# Patient Record
Sex: Male | Born: 1945 | Race: White | Hispanic: No | Marital: Married | Smoking: Never smoker
Health system: Southern US, Community
[De-identification: ages and names within clinical notes are randomized; demographics above are authoritative.]

## PROBLEM LIST (undated history)

## (undated) DIAGNOSIS — I509 Heart failure, unspecified: Secondary | ICD-10-CM

---

## 2006-10-16 ENCOUNTER — Ambulatory Visit: Payer: Self-pay | Admitting: Anesthesiology

## 2006-10-21 ENCOUNTER — Ambulatory Visit: Payer: Self-pay | Admitting: Anesthesiology

## 2006-12-11 ENCOUNTER — Ambulatory Visit: Payer: Self-pay | Admitting: Anesthesiology

## 2007-02-06 ENCOUNTER — Ambulatory Visit: Payer: Self-pay | Admitting: Anesthesiology

## 2007-04-07 ENCOUNTER — Ambulatory Visit: Payer: Self-pay | Admitting: Anesthesiology

## 2009-07-13 ENCOUNTER — Ambulatory Visit: Payer: Self-pay | Admitting: Anesthesiology

## 2011-06-18 ENCOUNTER — Ambulatory Visit: Payer: Self-pay | Admitting: Anesthesiology

## 2011-07-10 ENCOUNTER — Ambulatory Visit: Payer: Self-pay | Admitting: Anesthesiology

## 2011-08-28 ENCOUNTER — Other Ambulatory Visit: Payer: Self-pay | Admitting: *Deleted

## 2011-08-28 ENCOUNTER — Other Ambulatory Visit: Payer: Self-pay | Admitting: Specialist

## 2011-08-28 DIAGNOSIS — M549 Dorsalgia, unspecified: Secondary | ICD-10-CM

## 2015-02-02 DIAGNOSIS — M5416 Radiculopathy, lumbar region: Secondary | ICD-10-CM | POA: Diagnosis not present

## 2015-02-02 DIAGNOSIS — M5136 Other intervertebral disc degeneration, lumbar region: Secondary | ICD-10-CM | POA: Diagnosis not present

## 2015-04-27 DIAGNOSIS — H35371 Puckering of macula, right eye: Secondary | ICD-10-CM | POA: Diagnosis not present

## 2016-03-11 DIAGNOSIS — M5136 Other intervertebral disc degeneration, lumbar region: Secondary | ICD-10-CM | POA: Diagnosis not present

## 2016-03-11 DIAGNOSIS — M5416 Radiculopathy, lumbar region: Secondary | ICD-10-CM | POA: Diagnosis not present

## 2016-03-11 DIAGNOSIS — M5030 Other cervical disc degeneration, unspecified cervical region: Secondary | ICD-10-CM | POA: Diagnosis not present

## 2016-03-11 DIAGNOSIS — M5412 Radiculopathy, cervical region: Secondary | ICD-10-CM | POA: Diagnosis not present

## 2016-11-13 DIAGNOSIS — M5136 Other intervertebral disc degeneration, lumbar region: Secondary | ICD-10-CM | POA: Diagnosis not present

## 2016-11-13 DIAGNOSIS — M5030 Other cervical disc degeneration, unspecified cervical region: Secondary | ICD-10-CM | POA: Diagnosis not present

## 2016-11-13 DIAGNOSIS — M5412 Radiculopathy, cervical region: Secondary | ICD-10-CM | POA: Diagnosis not present

## 2016-11-13 DIAGNOSIS — M5416 Radiculopathy, lumbar region: Secondary | ICD-10-CM | POA: Diagnosis not present

## 2016-11-13 DIAGNOSIS — M7542 Impingement syndrome of left shoulder: Secondary | ICD-10-CM | POA: Diagnosis not present

## 2017-02-21 DIAGNOSIS — M5030 Other cervical disc degeneration, unspecified cervical region: Secondary | ICD-10-CM | POA: Diagnosis not present

## 2017-02-21 DIAGNOSIS — M5412 Radiculopathy, cervical region: Secondary | ICD-10-CM | POA: Diagnosis not present

## 2017-02-21 DIAGNOSIS — M5136 Other intervertebral disc degeneration, lumbar region: Secondary | ICD-10-CM | POA: Diagnosis not present

## 2017-02-21 DIAGNOSIS — M5416 Radiculopathy, lumbar region: Secondary | ICD-10-CM | POA: Diagnosis not present

## 2017-04-03 DIAGNOSIS — M5416 Radiculopathy, lumbar region: Secondary | ICD-10-CM | POA: Diagnosis not present

## 2017-04-03 DIAGNOSIS — M5136 Other intervertebral disc degeneration, lumbar region: Secondary | ICD-10-CM | POA: Diagnosis not present

## 2017-07-23 DIAGNOSIS — M7542 Impingement syndrome of left shoulder: Secondary | ICD-10-CM | POA: Diagnosis not present

## 2017-07-23 DIAGNOSIS — M76892 Other specified enthesopathies of left lower limb, excluding foot: Secondary | ICD-10-CM | POA: Diagnosis not present

## 2017-07-23 DIAGNOSIS — M5416 Radiculopathy, lumbar region: Secondary | ICD-10-CM | POA: Diagnosis not present

## 2017-07-23 DIAGNOSIS — M5412 Radiculopathy, cervical region: Secondary | ICD-10-CM | POA: Diagnosis not present

## 2017-07-23 DIAGNOSIS — M5030 Other cervical disc degeneration, unspecified cervical region: Secondary | ICD-10-CM | POA: Diagnosis not present

## 2017-07-23 DIAGNOSIS — M5136 Other intervertebral disc degeneration, lumbar region: Secondary | ICD-10-CM | POA: Diagnosis not present

## 2017-11-28 DIAGNOSIS — M5416 Radiculopathy, lumbar region: Secondary | ICD-10-CM | POA: Diagnosis not present

## 2017-11-28 DIAGNOSIS — M5030 Other cervical disc degeneration, unspecified cervical region: Secondary | ICD-10-CM | POA: Diagnosis not present

## 2017-11-28 DIAGNOSIS — M5412 Radiculopathy, cervical region: Secondary | ICD-10-CM | POA: Diagnosis not present

## 2017-11-28 DIAGNOSIS — M5136 Other intervertebral disc degeneration, lumbar region: Secondary | ICD-10-CM | POA: Diagnosis not present

## 2017-11-28 DIAGNOSIS — M7542 Impingement syndrome of left shoulder: Secondary | ICD-10-CM | POA: Diagnosis not present

## 2017-11-28 DIAGNOSIS — M76892 Other specified enthesopathies of left lower limb, excluding foot: Secondary | ICD-10-CM | POA: Diagnosis not present

## 2018-01-21 DIAGNOSIS — M5136 Other intervertebral disc degeneration, lumbar region: Secondary | ICD-10-CM | POA: Diagnosis not present

## 2018-01-21 DIAGNOSIS — Z125 Encounter for screening for malignant neoplasm of prostate: Secondary | ICD-10-CM | POA: Diagnosis not present

## 2018-01-21 DIAGNOSIS — M1990 Unspecified osteoarthritis, unspecified site: Secondary | ICD-10-CM | POA: Diagnosis not present

## 2018-01-21 DIAGNOSIS — I350 Nonrheumatic aortic (valve) stenosis: Secondary | ICD-10-CM | POA: Diagnosis not present

## 2018-01-21 DIAGNOSIS — Z0001 Encounter for general adult medical examination with abnormal findings: Secondary | ICD-10-CM | POA: Diagnosis not present

## 2018-01-26 DIAGNOSIS — I350 Nonrheumatic aortic (valve) stenosis: Secondary | ICD-10-CM | POA: Diagnosis not present

## 2018-04-28 DIAGNOSIS — M5416 Radiculopathy, lumbar region: Secondary | ICD-10-CM | POA: Diagnosis not present

## 2018-04-28 DIAGNOSIS — M5136 Other intervertebral disc degeneration, lumbar region: Secondary | ICD-10-CM | POA: Diagnosis not present

## 2018-06-26 DIAGNOSIS — M47816 Spondylosis without myelopathy or radiculopathy, lumbar region: Secondary | ICD-10-CM | POA: Diagnosis not present

## 2018-06-26 DIAGNOSIS — M5136 Other intervertebral disc degeneration, lumbar region: Secondary | ICD-10-CM | POA: Diagnosis not present

## 2018-06-26 DIAGNOSIS — M545 Low back pain: Secondary | ICD-10-CM | POA: Diagnosis not present

## 2018-06-26 DIAGNOSIS — M5416 Radiculopathy, lumbar region: Secondary | ICD-10-CM | POA: Diagnosis not present

## 2018-06-29 ENCOUNTER — Other Ambulatory Visit: Payer: Self-pay | Admitting: Physical Medicine and Rehabilitation

## 2018-06-29 DIAGNOSIS — M5136 Other intervertebral disc degeneration, lumbar region: Secondary | ICD-10-CM

## 2018-07-09 ENCOUNTER — Ambulatory Visit
Admission: RE | Admit: 2018-07-09 | Discharge: 2018-07-09 | Disposition: A | Discharge status: Home / Self Care | Payer: Medicare HMO | Source: Ambulatory Visit | Attending: Physical Medicine and Rehabilitation | Admitting: Physical Medicine and Rehabilitation

## 2018-07-09 DIAGNOSIS — M48061 Spinal stenosis, lumbar region without neurogenic claudication: Secondary | ICD-10-CM | POA: Diagnosis not present

## 2018-07-09 DIAGNOSIS — M5136 Other intervertebral disc degeneration, lumbar region: Secondary | ICD-10-CM | POA: Insufficient documentation

## 2018-07-20 DIAGNOSIS — M5416 Radiculopathy, lumbar region: Secondary | ICD-10-CM | POA: Diagnosis not present

## 2018-07-20 DIAGNOSIS — M5136 Other intervertebral disc degeneration, lumbar region: Secondary | ICD-10-CM | POA: Diagnosis not present

## 2018-07-20 DIAGNOSIS — M48062 Spinal stenosis, lumbar region with neurogenic claudication: Secondary | ICD-10-CM | POA: Diagnosis not present

## 2019-01-22 DIAGNOSIS — M7541 Impingement syndrome of right shoulder: Secondary | ICD-10-CM | POA: Diagnosis not present

## 2019-01-22 DIAGNOSIS — M5412 Radiculopathy, cervical region: Secondary | ICD-10-CM | POA: Diagnosis not present

## 2019-01-22 DIAGNOSIS — M5416 Radiculopathy, lumbar region: Secondary | ICD-10-CM | POA: Diagnosis not present

## 2019-01-22 DIAGNOSIS — M5136 Other intervertebral disc degeneration, lumbar region: Secondary | ICD-10-CM | POA: Diagnosis not present

## 2019-01-22 DIAGNOSIS — M7542 Impingement syndrome of left shoulder: Secondary | ICD-10-CM | POA: Diagnosis not present

## 2019-01-22 DIAGNOSIS — M5030 Other cervical disc degeneration, unspecified cervical region: Secondary | ICD-10-CM | POA: Diagnosis not present

## 2019-08-31 DIAGNOSIS — M7542 Impingement syndrome of left shoulder: Secondary | ICD-10-CM | POA: Diagnosis not present

## 2019-08-31 DIAGNOSIS — M7541 Impingement syndrome of right shoulder: Secondary | ICD-10-CM | POA: Diagnosis not present

## 2019-09-22 DIAGNOSIS — M48062 Spinal stenosis, lumbar region with neurogenic claudication: Secondary | ICD-10-CM | POA: Diagnosis not present

## 2019-09-22 DIAGNOSIS — M5416 Radiculopathy, lumbar region: Secondary | ICD-10-CM | POA: Diagnosis not present

## 2019-09-22 DIAGNOSIS — M5030 Other cervical disc degeneration, unspecified cervical region: Secondary | ICD-10-CM | POA: Diagnosis not present

## 2019-09-22 DIAGNOSIS — M5136 Other intervertebral disc degeneration, lumbar region: Secondary | ICD-10-CM | POA: Diagnosis not present

## 2019-09-22 DIAGNOSIS — M7541 Impingement syndrome of right shoulder: Secondary | ICD-10-CM | POA: Diagnosis not present

## 2019-09-22 DIAGNOSIS — M7542 Impingement syndrome of left shoulder: Secondary | ICD-10-CM | POA: Diagnosis not present

## 2019-09-22 DIAGNOSIS — M5412 Radiculopathy, cervical region: Secondary | ICD-10-CM | POA: Diagnosis not present

## 2020-03-22 DIAGNOSIS — M5030 Other cervical disc degeneration, unspecified cervical region: Secondary | ICD-10-CM | POA: Diagnosis not present

## 2020-03-22 DIAGNOSIS — M5136 Other intervertebral disc degeneration, lumbar region: Secondary | ICD-10-CM | POA: Diagnosis not present

## 2020-03-22 DIAGNOSIS — M5416 Radiculopathy, lumbar region: Secondary | ICD-10-CM | POA: Diagnosis not present

## 2020-03-22 DIAGNOSIS — M5412 Radiculopathy, cervical region: Secondary | ICD-10-CM | POA: Diagnosis not present

## 2020-03-22 DIAGNOSIS — M7542 Impingement syndrome of left shoulder: Secondary | ICD-10-CM | POA: Diagnosis not present

## 2020-03-22 DIAGNOSIS — M7541 Impingement syndrome of right shoulder: Secondary | ICD-10-CM | POA: Diagnosis not present

## 2020-03-22 DIAGNOSIS — M48062 Spinal stenosis, lumbar region with neurogenic claudication: Secondary | ICD-10-CM | POA: Diagnosis not present

## 2020-05-31 ENCOUNTER — Other Ambulatory Visit: Payer: Self-pay | Admitting: Physical Medicine and Rehabilitation

## 2020-05-31 DIAGNOSIS — M4802 Spinal stenosis, cervical region: Secondary | ICD-10-CM | POA: Diagnosis not present

## 2020-05-31 DIAGNOSIS — M5412 Radiculopathy, cervical region: Secondary | ICD-10-CM | POA: Diagnosis not present

## 2020-05-31 DIAGNOSIS — M75102 Unspecified rotator cuff tear or rupture of left shoulder, not specified as traumatic: Secondary | ICD-10-CM | POA: Diagnosis not present

## 2020-05-31 DIAGNOSIS — M5030 Other cervical disc degeneration, unspecified cervical region: Secondary | ICD-10-CM

## 2020-06-07 DIAGNOSIS — M25512 Pain in left shoulder: Secondary | ICD-10-CM | POA: Diagnosis not present

## 2020-06-07 DIAGNOSIS — G8929 Other chronic pain: Secondary | ICD-10-CM | POA: Diagnosis not present

## 2020-06-07 DIAGNOSIS — M19212 Secondary osteoarthritis, left shoulder: Secondary | ICD-10-CM | POA: Diagnosis not present

## 2020-06-07 DIAGNOSIS — M12812 Other specific arthropathies, not elsewhere classified, left shoulder: Secondary | ICD-10-CM | POA: Diagnosis not present

## 2020-06-07 DIAGNOSIS — M75122 Complete rotator cuff tear or rupture of left shoulder, not specified as traumatic: Secondary | ICD-10-CM | POA: Diagnosis not present

## 2020-06-07 DIAGNOSIS — M7552 Bursitis of left shoulder: Secondary | ICD-10-CM | POA: Diagnosis not present

## 2020-06-08 ENCOUNTER — Ambulatory Visit: Payer: Medicare HMO

## 2020-06-14 ENCOUNTER — Ambulatory Visit
Admission: RE | Admit: 2020-06-14 | Discharge: 2020-06-14 | Disposition: A | Discharge status: Home / Self Care | Payer: Medicare HMO | Source: Ambulatory Visit | Attending: Physical Medicine and Rehabilitation | Admitting: Physical Medicine and Rehabilitation

## 2020-06-14 ENCOUNTER — Other Ambulatory Visit: Payer: Self-pay

## 2020-06-14 DIAGNOSIS — M542 Cervicalgia: Secondary | ICD-10-CM | POA: Diagnosis not present

## 2020-06-14 DIAGNOSIS — M5030 Other cervical disc degeneration, unspecified cervical region: Secondary | ICD-10-CM | POA: Diagnosis not present

## 2020-07-10 DIAGNOSIS — M5030 Other cervical disc degeneration, unspecified cervical region: Secondary | ICD-10-CM | POA: Diagnosis not present

## 2020-07-10 DIAGNOSIS — M48062 Spinal stenosis, lumbar region with neurogenic claudication: Secondary | ICD-10-CM | POA: Diagnosis not present

## 2020-07-10 DIAGNOSIS — M4802 Spinal stenosis, cervical region: Secondary | ICD-10-CM | POA: Diagnosis not present

## 2020-07-10 DIAGNOSIS — M7542 Impingement syndrome of left shoulder: Secondary | ICD-10-CM | POA: Diagnosis not present

## 2020-07-10 DIAGNOSIS — M5412 Radiculopathy, cervical region: Secondary | ICD-10-CM | POA: Diagnosis not present

## 2020-07-10 DIAGNOSIS — M5416 Radiculopathy, lumbar region: Secondary | ICD-10-CM | POA: Diagnosis not present

## 2020-07-10 DIAGNOSIS — M5136 Other intervertebral disc degeneration, lumbar region: Secondary | ICD-10-CM | POA: Diagnosis not present

## 2020-07-10 DIAGNOSIS — M7541 Impingement syndrome of right shoulder: Secondary | ICD-10-CM | POA: Diagnosis not present

## 2020-07-20 DIAGNOSIS — M4802 Spinal stenosis, cervical region: Secondary | ICD-10-CM | POA: Diagnosis not present

## 2020-07-20 DIAGNOSIS — M5030 Other cervical disc degeneration, unspecified cervical region: Secondary | ICD-10-CM | POA: Diagnosis not present

## 2020-07-20 DIAGNOSIS — M5412 Radiculopathy, cervical region: Secondary | ICD-10-CM | POA: Diagnosis not present

## 2020-09-14 ENCOUNTER — Encounter: Payer: Self-pay | Admitting: Emergency Medicine

## 2020-09-14 ENCOUNTER — Other Ambulatory Visit: Payer: Self-pay

## 2020-09-14 ENCOUNTER — Emergency Department: Payer: Medicare HMO

## 2020-09-14 ENCOUNTER — Inpatient Hospital Stay
Admission: EM | Admit: 2020-09-14 | Discharge: 2020-09-15 | DRG: 291 | Disposition: A | Discharge status: Hospice | Payer: Medicare HMO | Attending: Internal Medicine | Admitting: Internal Medicine

## 2020-09-14 DIAGNOSIS — R7401 Elevation of levels of liver transaminase levels: Secondary | ICD-10-CM | POA: Diagnosis present

## 2020-09-14 DIAGNOSIS — D649 Anemia, unspecified: Secondary | ICD-10-CM | POA: Diagnosis present

## 2020-09-14 DIAGNOSIS — I5031 Acute diastolic (congestive) heart failure: Secondary | ICD-10-CM | POA: Diagnosis not present

## 2020-09-14 DIAGNOSIS — Z66 Do not resuscitate: Secondary | ICD-10-CM | POA: Diagnosis not present

## 2020-09-14 DIAGNOSIS — M1990 Unspecified osteoarthritis, unspecified site: Secondary | ICD-10-CM | POA: Diagnosis present

## 2020-09-14 DIAGNOSIS — J9811 Atelectasis: Secondary | ICD-10-CM | POA: Diagnosis present

## 2020-09-14 DIAGNOSIS — R350 Frequency of micturition: Secondary | ICD-10-CM | POA: Diagnosis not present

## 2020-09-14 DIAGNOSIS — I5043 Acute on chronic combined systolic (congestive) and diastolic (congestive) heart failure: Secondary | ICD-10-CM | POA: Diagnosis not present

## 2020-09-14 DIAGNOSIS — Z20822 Contact with and (suspected) exposure to covid-19: Secondary | ICD-10-CM | POA: Diagnosis present

## 2020-09-14 DIAGNOSIS — R64 Cachexia: Secondary | ICD-10-CM | POA: Diagnosis present

## 2020-09-14 DIAGNOSIS — I350 Nonrheumatic aortic (valve) stenosis: Secondary | ICD-10-CM | POA: Diagnosis not present

## 2020-09-14 DIAGNOSIS — I493 Ventricular premature depolarization: Secondary | ICD-10-CM | POA: Diagnosis present

## 2020-09-14 DIAGNOSIS — F0390 Unspecified dementia without behavioral disturbance: Secondary | ICD-10-CM | POA: Diagnosis present

## 2020-09-14 DIAGNOSIS — Z515 Encounter for palliative care: Secondary | ICD-10-CM | POA: Diagnosis not present

## 2020-09-14 DIAGNOSIS — I5041 Acute combined systolic (congestive) and diastolic (congestive) heart failure: Secondary | ICD-10-CM | POA: Diagnosis not present

## 2020-09-14 DIAGNOSIS — I509 Heart failure, unspecified: Secondary | ICD-10-CM

## 2020-09-14 DIAGNOSIS — I700 Atherosclerosis of aorta: Secondary | ICD-10-CM | POA: Diagnosis present

## 2020-09-14 DIAGNOSIS — Z79891 Long term (current) use of opiate analgesic: Secondary | ICD-10-CM | POA: Diagnosis not present

## 2020-09-14 DIAGNOSIS — I110 Hypertensive heart disease with heart failure: Secondary | ICD-10-CM | POA: Diagnosis not present

## 2020-09-14 DIAGNOSIS — I420 Dilated cardiomyopathy: Secondary | ICD-10-CM

## 2020-09-14 DIAGNOSIS — Z681 Body mass index (BMI) 19 or less, adult: Secondary | ICD-10-CM | POA: Diagnosis not present

## 2020-09-14 DIAGNOSIS — F1020 Alcohol dependence, uncomplicated: Secondary | ICD-10-CM | POA: Diagnosis present

## 2020-09-14 DIAGNOSIS — R059 Cough, unspecified: Secondary | ICD-10-CM | POA: Diagnosis not present

## 2020-09-14 DIAGNOSIS — R140 Abdominal distension (gaseous): Secondary | ICD-10-CM | POA: Diagnosis not present

## 2020-09-14 DIAGNOSIS — E43 Unspecified severe protein-calorie malnutrition: Secondary | ICD-10-CM

## 2020-09-14 DIAGNOSIS — I10 Essential (primary) hypertension: Secondary | ICD-10-CM | POA: Diagnosis present

## 2020-09-14 DIAGNOSIS — R410 Disorientation, unspecified: Secondary | ICD-10-CM | POA: Diagnosis not present

## 2020-09-14 DIAGNOSIS — J90 Pleural effusion, not elsewhere classified: Secondary | ICD-10-CM | POA: Diagnosis not present

## 2020-09-14 DIAGNOSIS — I517 Cardiomegaly: Secondary | ICD-10-CM | POA: Diagnosis not present

## 2020-09-14 DIAGNOSIS — R531 Weakness: Secondary | ICD-10-CM | POA: Diagnosis not present

## 2020-09-14 DIAGNOSIS — R778 Other specified abnormalities of plasma proteins: Secondary | ICD-10-CM

## 2020-09-14 DIAGNOSIS — Z791 Long term (current) use of non-steroidal anti-inflammatories (NSAID): Secondary | ICD-10-CM

## 2020-09-14 DIAGNOSIS — Z7189 Other specified counseling: Secondary | ICD-10-CM | POA: Diagnosis not present

## 2020-09-14 DIAGNOSIS — R0602 Shortness of breath: Secondary | ICD-10-CM | POA: Diagnosis not present

## 2020-09-14 HISTORY — DX: Heart failure, unspecified: I50.9

## 2020-09-14 LAB — URINALYSIS, COMPLETE (UACMP) WITH MICROSCOPIC
Bacteria, UA: NONE SEEN
Bilirubin Urine: NEGATIVE
Glucose, UA: NEGATIVE mg/dL
Ketones, ur: 5 mg/dL — AB
Leukocytes,Ua: NEGATIVE
Nitrite: NEGATIVE
Protein, ur: 30 mg/dL — AB
Specific Gravity, Urine: 1.027 (ref 1.005–1.030)
Squamous Epithelial / HPF: NONE SEEN (ref 0–5)
pH: 5 (ref 5.0–8.0)

## 2020-09-14 LAB — PROCALCITONIN: Procalcitonin: <0.1 ng/mL

## 2020-09-14 LAB — HEPATIC FUNCTION PANEL
ALT: 50 U/L — ABNORMAL HIGH (ref 0–44)
AST: 61 U/L — ABNORMAL HIGH (ref 15–41)
Albumin: 4.3 g/dL (ref 3.5–5.0)
Alkaline Phosphatase: 55 U/L (ref 38–126)
Bilirubin, Direct: 0.6 mg/dL — ABNORMAL HIGH (ref 0.0–0.2)
Indirect Bilirubin: 1.6 mg/dL — ABNORMAL HIGH (ref 0.3–0.9)
Total Bilirubin: 2.2 mg/dL — ABNORMAL HIGH (ref 0.3–1.2)
Total Protein: 6.8 g/dL (ref 6.5–8.1)

## 2020-09-14 LAB — RESP PANEL BY RT-PCR (FLU A&B, COVID) ARPGX2
Influenza A by PCR: NEGATIVE
Influenza B by PCR: NEGATIVE
SARS Coronavirus 2 by RT PCR: NEGATIVE

## 2020-09-14 LAB — CBC
HCT: 44.2 % (ref 39.0–52.0)
Hemoglobin: 14.5 g/dL (ref 13.0–17.0)
MCH: 31.7 pg (ref 26.0–34.0)
MCHC: 32.8 g/dL (ref 30.0–36.0)
MCV: 96.5 fL (ref 80.0–100.0)
Platelets: 174 10*3/uL (ref 150–400)
RBC: 4.58 MIL/uL (ref 4.22–5.81)
RDW: 17.1 % — ABNORMAL HIGH (ref 11.5–15.5)
WBC: 5.9 10*3/uL (ref 4.0–10.5)
nRBC: 0 % (ref 0.0–0.2)

## 2020-09-14 LAB — T4, FREE: Free T4: 1.19 ng/dL — ABNORMAL HIGH (ref 0.61–1.12)

## 2020-09-14 LAB — BASIC METABOLIC PANEL
Anion gap: 9 (ref 5–15)
BUN: 33 mg/dL — ABNORMAL HIGH (ref 8–23)
CO2: 25 mmol/L (ref 22–32)
Calcium: 9.8 mg/dL (ref 8.9–10.3)
Chloride: 106 mmol/L (ref 98–111)
Creatinine, Ser: 1.14 mg/dL (ref 0.61–1.24)
GFR, Estimated: >60 mL/min (ref >60)
Glucose, Bld: 104 mg/dL — ABNORMAL HIGH (ref 70–99)
Potassium: 4.1 mmol/L (ref 3.5–5.1)
Sodium: 140 mmol/L (ref 135–145)

## 2020-09-14 LAB — AMMONIA: Ammonia: 12 umol/L (ref 9–35)

## 2020-09-14 LAB — BRAIN NATRIURETIC PEPTIDE: B Natriuretic Peptide: >4500 pg/mL — ABNORMAL HIGH (ref 0.0–100.0)

## 2020-09-14 LAB — MAGNESIUM: Magnesium: 1.8 mg/dL (ref 1.7–2.4)

## 2020-09-14 LAB — TROPONIN I (HIGH SENSITIVITY)
Troponin I (High Sensitivity): 85 ng/L — ABNORMAL HIGH (ref <18)
Troponin I (High Sensitivity): 68 ng/L — ABNORMAL HIGH (ref <18)

## 2020-09-14 LAB — TSH: TSH: 2.012 u[IU]/mL (ref 0.350–4.500)

## 2020-09-14 LAB — PHOSPHORUS: Phosphorus: 3.1 mg/dL (ref 2.5–4.6)

## 2020-09-14 MED ORDER — DOXYCYCLINE HYCLATE 100 MG PO TABS
100.0000 mg | ORAL_TABLET | Freq: Once | ORAL | Status: DC
  Filled 2020-09-14: qty 1

## 2020-09-14 MED ORDER — LORAZEPAM 1 MG PO TABS: 1.0000 mg | ORAL_TABLET | ORAL | Status: DC | PRN

## 2020-09-14 MED ORDER — SODIUM CHLORIDE 0.9 % IV SOLN: 250.0000 mL | INTRAVENOUS | Status: DC | PRN

## 2020-09-14 MED ORDER — SODIUM CHLORIDE 0.9% FLUSH: 3.0000 mL | INTRAVENOUS | Status: DC | PRN

## 2020-09-14 MED ORDER — ADULT MULTIVITAMIN W/MINERALS CH
1.0000 | ORAL_TABLET | Freq: Every day | ORAL | Status: DC
  Administered 2020-09-14 – 2020-09-15 (×2): 1 via ORAL
  Filled 2020-09-14 (×2): qty 1

## 2020-09-14 MED ORDER — SODIUM CHLORIDE 0.9% FLUSH
3.0000 mL | Freq: Two times a day (BID) | INTRAVENOUS | Status: DC
  Administered 2020-09-14: 3 mL via INTRAVENOUS

## 2020-09-14 MED ORDER — THIAMINE HCL 100 MG PO TABS
100.0000 mg | ORAL_TABLET | Freq: Every day | ORAL | Status: DC
  Administered 2020-09-14 – 2020-09-15 (×2): 100 mg via ORAL
  Filled 2020-09-14 (×2): qty 1

## 2020-09-14 MED ORDER — LORAZEPAM 2 MG PO TABS
0.0000 mg | ORAL_TABLET | Freq: Four times a day (QID) | ORAL | Status: DC
  Administered 2020-09-14: 2 mg via ORAL
  Filled 2020-09-14: qty 1

## 2020-09-14 MED ORDER — TRAMADOL HCL 50 MG PO TABS
50.0000 mg | ORAL_TABLET | Freq: Two times a day (BID) | ORAL | Status: DC
  Administered 2020-09-14: 100 mg via ORAL
  Administered 2020-09-15: 50 mg via ORAL
  Filled 2020-09-14: qty 2
  Filled 2020-09-14: qty 1

## 2020-09-14 MED ORDER — ENOXAPARIN SODIUM 40 MG/0.4ML IJ SOSY
40.0000 mg | PREFILLED_SYRINGE | INTRAMUSCULAR | Status: DC
  Administered 2020-09-14: 40 mg via SUBCUTANEOUS
  Filled 2020-09-14: qty 0.4

## 2020-09-14 MED ORDER — IOHEXOL 350 MG/ML SOLN
75.0000 mL | Freq: Once | INTRAVENOUS | Status: AC | PRN
  Administered 2020-09-14: 75 mL via INTRAVENOUS

## 2020-09-14 MED ORDER — ONDANSETRON HCL 4 MG/2ML IJ SOLN: 4.0000 mg | Freq: Four times a day (QID) | INTRAMUSCULAR | Status: DC | PRN

## 2020-09-14 MED ORDER — FOLIC ACID 1 MG PO TABS
1.0000 mg | ORAL_TABLET | Freq: Every day | ORAL | Status: DC
  Administered 2020-09-14 – 2020-09-15 (×2): 1 mg via ORAL
  Filled 2020-09-14 (×2): qty 1

## 2020-09-14 MED ORDER — THIAMINE HCL 100 MG/ML IJ SOLN: 100.0000 mg | Freq: Every day | INTRAMUSCULAR | Status: DC

## 2020-09-14 MED ORDER — LORAZEPAM 2 MG PO TABS: 0.0000 mg | ORAL_TABLET | Freq: Two times a day (BID) | ORAL | Status: DC

## 2020-09-14 MED ORDER — ACETAMINOPHEN 325 MG PO TABS: 650.0000 mg | ORAL_TABLET | ORAL | Status: DC | PRN

## 2020-09-14 MED ORDER — METOPROLOL SUCCINATE ER 25 MG PO TB24
25.0000 mg | ORAL_TABLET | Freq: Every day | ORAL | Status: DC
  Administered 2020-09-14 – 2020-09-15 (×2): 25 mg via ORAL
  Filled 2020-09-14 (×2): qty 1

## 2020-09-14 MED ORDER — FUROSEMIDE 10 MG/ML IJ SOLN
40.0000 mg | Freq: Two times a day (BID) | INTRAMUSCULAR | Status: DC
  Administered 2020-09-14 – 2020-09-15 (×2): 40 mg via INTRAVENOUS
  Filled 2020-09-14 (×2): qty 4

## 2020-09-14 MED ORDER — LORAZEPAM 2 MG/ML IJ SOLN
1.0000 mg | INTRAMUSCULAR | Status: DC | PRN
Start: 2020-09-14 — End: 2020-09-15

## 2020-09-14 NOTE — H&P (Addendum)
History and Physical    Rufina FalcoJohn G Moser QMV:[id number] DOB: March 14, 1946 DOA: 09/14/2020  PCP: Gracelyn NurseJohnston, Maurion D, MD   Patient coming from: Home  I have personally briefly reviewed patient[facility]  Chief Complaint: Shortness of breath  HPI: Rufina FalcoJohn G Dorion is a 75 y.o. male with medical history significant for severe aortic stenosis, alcohol dependence who presents to the ER with his wife for evaluation of worsening shortness of breath over the last 1 week and increased fatigue and weakness.  Patient's wife states that she noticed he has become more short of breath with any form of exertion and gets increasingly tired and fatigued.  She also thinks that he is more confused than his baseline.  Patient also has a cough productive of clear phlegm. She states that patient has a known valvular disease and was offered surgery a couple of years ago but patient declined. He denies having any chest pain, no leg swelling, no orthopnea or paroxysmal nocturnal dyspnea. He denies having any fever or chills, no abdominal pain, no dizziness, no lightheadedness, no headache, no urinary symptoms, no changes in his bowel habits, no palpitations, no diaphoresis or any syncopal episode. Labs show sodium 140, potassium 4.1, chloride 106, bicarb 25, glucose 104, BUN 33, creatinine 1.14, calcium 9.8, alkaline phosphatase 55, albumin 4.3, AST 61, ALT 15, total protein 6.8, ammonia 12, BNP 4500, troponin 85 >> 68, white cell count 5.9, hemoglobin 14.5, hematocrit 44.2, MCV 96.5, RDW 17.1, platelet count 174, TSH 2.012 Respiratory viral panel is negative Chest x-ray reviewed by me shows mild scarring left base. No edema or consolidation. Mild cardiac enlargement. CT angiogram of the chest shows no evidence of pulmonary embolism. Moderate left and small right pleural effusions with associated mild atelectasis. Minimal diffuse bilateral ground-glass opacities likely reflect mild pulmonary edema  and/or atypical pneumonia. Cardiomegaly. Small volume fluid in the pelvis and anasarca. Aortic Atherosclerosis  Twelve-lead EKG reviewed by me shows sinus rhythm, PVCs and Q waves most likely related to LVH.   ED Course: Patient is 75 year old who presents to the ER for evaluation of worsening shortness of breath, cough productive of clear phlegm and increased fatigue.  Imaging shows bilateral pleural effusions and patient's BNP is elevated.  He has a history of severe aortic stenosis and declined surgery in the past. He received IV Lasix in the ER and will be admitted to the hospital for further evaluation.   Review of Systems: As per HPI otherwise all other systems reviewed and negative.    Past Medical History:  Diagnosis Date  . CHF (congestive heart failure) (HCC)       reports that he has never smoked. He has never used smokeless tobacco. He reports current alcohol use of about 3.0 standard drinks of alcohol per week. No history on file for drug use.  No Known Allergies  Family History  Family history unknown: Yes     Prior to Admission medications   Medication Sig Start Date End Date Taking? Authorizing Provider  meloxicam (MOBIC) 15 MG tablet Take 15 mg by mouth daily. 06/23/20  Yes [provider]  traMADol (ULTRAM) 50 MG tablet Take 50-100 mg by mouth in the morning, at noon, and at bedtime. 07/30/20  Yes [provider]    Physical Exam: Vitals:   09/14/20 1530 09/14/20 1600 09/14/20 1630 09/14/20 1720  BP: (!) 117/101 (!) 161/101 (!) 130/98 (!) 144/103  Pulse: 79 79 78 75  Resp: 17 18 19  16  Temp:    98.4 F (36.9 C)  TempSrc:      SpO2: 97% 100% 98% 100%  Weight:      Height:         Vitals:   09/14/20 1530 09/14/20 1600 09/14/20 1630 09/14/20 1720  BP: (!) 117/101 (!) 161/101 (!) 130/98 (!) 144/103  Pulse: 79 79 78 75  Resp: Temp:    98.4 F (36.9 C)  TempSrc:      SpO2: 97% 100% 98% 100%  Weight:      Height:           Constitutional: Alert and oriented x 3 . Not in any apparent distress.  He is comfortable HEENT:      Head: Normocephalic and atraumatic.         Eyes: PERLA, EOMI, Conjunctivae are normal. Sclera is non-icteric.       Mouth/Throat: Mucous membranes are moist.       Neck: Supple with no signs of meningismus. Cardiovascular: Regular rate and rhythm.  Systolic ejection murmur, no gallops, or rubs. 2+ symmetrical distal pulses are present . No JVD. No LE edema Respiratory: Respiratory effort normal .Crackles at the bases bilaterally. No wheezes or rhonchi.  Gastrointestinal: Soft, non tender, and non distended with positive bowel sounds.  Genitourinary: No CVA tenderness. Musculoskeletal: Nontender with normal range of motion in all extremities. No cyanosis, or erythema of extremities. Neurologic:  Face is symmetric. Moving all extremities. No gross focal neurologic deficits . Skin: Skin is warm, dry.  No rash or ulcers Psychiatric: Mood and affect are normal   Labs on Admission: I have personally reviewed following labs and imaging studies  CBC: Recent Labs  Lab 09/14/20 1059  WBC 5.9  HGB 14.5  HCT 44.2  MCV 96.5  PLT 174   Basic Metabolic Panel: Recent Labs  Lab 09/14/20 1059  NA 140  K 4.1  CL 106  CO2 25  GLUCOSE 104*  BUN 33*  CREATININE 1.14  CALCIUM 9.8   GFR: Estimated Creatinine Clearance: 47.7 mL/min (by C-G formula based on SCr of 1.14 mg/dL). Liver Function Tests: Recent Labs  Lab 09/14/20 1059  AST 61*  ALT 50*  ALKPHOS 55  BILITOT 2.2*  PROT 6.8  ALBUMIN 4.3   No results for input(s): LIPASE, AMYLASE in the last 168 hours. Recent Labs  Lab 09/14/20 1130  AMMONIA 12   Coagulation Profile: No results for input(s): INR, PROTIME in the last 168 hours. Cardiac Enzymes: No results for input(s): CKTOTAL, CKMB, CKMBINDEX, TROPONINI in the last 168 hours. BNP (last 3 results) No results for input(s): PROBNP in the last 8760  hours. HbA1C: No results for input(s): HGBA1C in the last 72 hours. CBG: No results for input(s): GLUCAP in the last 168 hours. Lipid Profile: No results for input(s): CHOL, HDL, LDLCALC, TRIG, CHOLHDL, LDLDIRECT in the last 72 hours. Thyroid Function Tests: Recent Labs    09/14/20 1059  TSH 2.012  FREET4 1.19*   Anemia Panel: No results for input(s): VITAMINB12, FOLATE, FERRITIN, TIBC, IRON, RETICCTPCT in the last 72 hours. Urine analysis:    Component Value Date/Time   COLORURINE AMBER (A) 09/14/2020 1256   APPEARANCEUR HAZY (A) 09/14/2020 1256   LABSPEC 1.027 09/14/2020 1256   PHURINE 5.0 09/14/2020 1256   GLUCOSEU NEGATIVE 09/14/2020 1256   HGBUR SMALL (A) 09/14/2020 1256   BILIRUBINUR NEGATIVE 09/14/2020 1256   KETONESUR 5 (A) 09/14/2020 1256   PROTEINUR 30 (A)  09/14/2020 1256   NITRITE NEGATIVE 09/14/2020 1256   LEUKOCYTESUR NEGATIVE 09/14/2020 1256    Radiological Exams on Admission: CT Head Wo Contrast  Result Date: 09/14/2020 CLINICAL DATA:  Worsening confusion. EXAM: CT HEAD WITHOUT CONTRAST TECHNIQUE: Contiguous axial images were obtained from the base of the skull through the vertex without intravenous contrast. COMPARISON:  None. FINDINGS: Brain: Age related volume loss. Mild chronic small-vessel change of the white matter. No sign of acute infarction, mass lesion, hemorrhage, hydrocephalus or extra-axial collection. Vascular: There is atherosclerotic calcification of the major vessels at the base of the brain. Skull: No skull fracture. 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 IMPRESSION: 1. No acute finding by CT. Age related volume loss and mild chronic small-vessel change of the white matter. 2. 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  Electronically Signed   By: Paulina Fusi M.D.   On: 09/14/2020 14:33   CT Angio Chest PE W and/or Wo Contrast  Result Date: 09/14/2020 CLINICAL DATA:  Shortness of breath and productive cough over the past week as well as abdominal distension. EXAM: CT ANGIOGRAPHY CHEST CT ABDOMEN AND PELVIS WITH CONTRAST TECHNIQUE: Multidetector CT imaging of the chest was performed using the standard protocol during bolus administration of intravenous contrast. Multiplanar CT image reconstructions and MIPs were obtained to evaluate the vascular anatomy. Multidetector CT imaging of the abdomen and pelvis was performed using the standard protocol during bolus administration of intravenous contrast. CONTRAST:  44mL OMNIPAQUE IOHEXOL 350 MG/ML SOLN COMPARISON:  Same day chest radiograph, CT lumbar spine dated 07/09/2018. FINDINGS: CTA CHEST FINDINGS Cardiovascular: Satisfactory opacification of the pulmonary arteries to the segmental level. No evidence of pulmonary embolism. Annular calcifications are seen involving the aortic valve. Vascular calcifications are seen in the coronary arteries and aortic arch. The heart is enlarged. No pericardial effusion. Mediastinum/Nodes: Minimal diffuse bilateral ground-glass opacities are noted. There is no pneumothorax. Lungs/Pleura: There is a moderate left and small right pleural effusion with associated mild atelectasis. Minimal diffuse bilateral ground-glass opacities are noted. There is no pneumothorax. Musculoskeletal: Degenerative changes are most significant at T12-L1. Review of the MIP images confirms the above findings. CT ABDOMEN and PELVIS FINDINGS Hepatobiliary: No focal liver abnormality is seen. No gallstones, gallbladder wall thickening, or biliary dilatation. Pancreas: Unremarkable. No pancreatic ductal dilatation or surrounding inflammatory changes. Spleen: Normal in size without focal abnormality. Adrenals/Urinary Tract: Adrenal glands are unremarkable. Kidneys are normal,  without renal calculi, focal lesion, or hydronephrosis. Bladder is unremarkable. Stomach/Bowel: Stomach is within normal limits. No pericecal inflammatory changes are noted to suggest acute appendicitis. No evidence of bowel wall thickening, distention, or inflammatory changes. Vascular/Lymphatic: Aortic atherosclerosis. No enlarged abdominal or pelvic lymph nodes. Reproductive: Prostate is unremarkable. Bilateral hydroceles are partially imaged. Other: No abdominal wall hernia or abnormality. Small volume fluid is seen in the pelvis. Anasarca is noted Musculoskeletal: Severe degenerative changes are seen in the lumbar spine. Moderate bilateral degenerative changes are seen in both hips. Review of the MIP images confirms the above findings. IMPRESSION: 1. No evidence of pulmonary embolism. 2. Moderate left and small right pleural effusions with associated mild atelectasis. 3. Minimal diffuse bilateral ground-glass opacities likely reflect mild pulmonary edema and/or atypical pneumonia. 4. Cardiomegaly. 5. Small volume fluid in the pelvis and anasarca. Aortic Atherosclerosis (ICD10-I70.0). Electronically Signed   By:  Romona Curls M.D.   On: 09/14/2020 14:56   CT ABDOMEN PELVIS W CONTRAST  Result Date: 09/14/2020 CLINICAL DATA:  Shortness of breath and productive cough over the past week as well as abdominal distension. EXAM: CT ANGIOGRAPHY CHEST CT ABDOMEN AND PELVIS WITH CONTRAST TECHNIQUE: Multidetector CT imaging of the chest was performed using the standard protocol during bolus administration of intravenous contrast. Multiplanar CT image reconstructions and MIPs were obtained to evaluate the vascular anatomy. Multidetector CT imaging of the abdomen and pelvis was performed using the standard protocol during bolus administration of intravenous contrast. CONTRAST:  17mL OMNIPAQUE IOHEXOL 350 MG/ML SOLN COMPARISON:  Same day chest radiograph, CT lumbar spine dated 07/09/2018. FINDINGS: CTA CHEST FINDINGS  Cardiovascular: Satisfactory opacification of the pulmonary arteries to the segmental level. No evidence of pulmonary embolism. Annular calcifications are seen involving the aortic valve. Vascular calcifications are seen in the coronary arteries and aortic arch. The heart is enlarged. No pericardial effusion. Mediastinum/Nodes: Minimal diffuse bilateral ground-glass opacities are noted. There is no pneumothorax. Lungs/Pleura: There is a moderate left and small right pleural effusion with associated mild atelectasis. Minimal diffuse bilateral ground-glass opacities are noted. There is no pneumothorax. Musculoskeletal: Degenerative changes are most significant at T12-L1. Review of the MIP images confirms the above findings. CT ABDOMEN and PELVIS FINDINGS Hepatobiliary: No focal liver abnormality is seen. No gallstones, gallbladder wall thickening, or biliary dilatation. Pancreas: Unremarkable. No pancreatic ductal dilatation or surrounding inflammatory changes. Spleen: Normal in size without focal abnormality. Adrenals/Urinary Tract: Adrenal glands are unremarkable. Kidneys are normal, without renal calculi, focal lesion, or hydronephrosis. Bladder is unremarkable. Stomach/Bowel: Stomach is within normal limits. No pericecal inflammatory changes are noted to suggest acute appendicitis. No evidence of bowel wall thickening, distention, or inflammatory changes. Vascular/Lymphatic: Aortic atherosclerosis. No enlarged abdominal or pelvic lymph nodes. Reproductive: Prostate is unremarkable. Bilateral hydroceles are partially imaged. Other: No abdominal wall hernia or abnormality. Small volume fluid is seen in the pelvis. Anasarca is noted Musculoskeletal: Severe degenerative changes are seen in the lumbar spine. Moderate bilateral degenerative changes are seen in both hips. Review of the MIP images confirms the above findings. IMPRESSION: 1. No evidence of pulmonary embolism. 2. Moderate left and small right pleural  effusions with associated mild atelectasis. 3. Minimal diffuse bilateral ground-glass opacities likely reflect mild pulmonary edema and/or atypical pneumonia. 4. Cardiomegaly. 5. Small volume fluid in the pelvis and anasarca. Aortic Atherosclerosis (ICD10-I70.0). Electronically Signed   By: Romona Curls M.D.   On: 09/14/2020 14:56   DG Chest Portable 1 View  Result Date: 09/14/2020 CLINICAL DATA:  Shortness of breath EXAM: PORTABLE CHEST 1 VIEW COMPARISON:  None. FINDINGS: There is mild scarring in the left base. The lungs elsewhere are clear. There is cardiomegaly with pulmonary vascularity normal. No adenopathy. No bone lesions. IMPRESSION: Mild scarring left base. No edema or consolidation. Mild cardiac enlargement. Electronically Signed   By: Bretta Bang III M.D.   On: 09/14/2020 11:20     Assessment/Plan Principal Problem:   Acute CHF (congestive heart failure) (HCC) Active Problems:   Aortic stenosis, severe   Transaminitis   Essential hypertension    Acute CHF Unclear etiology Patient has a history of severe aortic stenosis and declined valve replacement in the past Will repeat 2D echocardiogram to assess LVEF Place patient on Lasix 40 mg IV every 12 Start patient on metoprolol 25 mg daily We will request cardiology consult    Transaminitis Most likely related to patient's history of alcohol dependence  We will place patient on CIWA protocol and administer lorazepam for CIWA score of 8 or greater.   Hypertension Start patient on low-dose metoprolol    DVT prophylaxis: Lovenox Code Status: full code Family Communication: Greater than 50% of time was spent discussing patient's condition and plan of care with him and his wife at the bedside.  All questions and concerns have been addressed.  They verbalized understanding and agree with the plan.  CODE STATUS was discussed and he is a full code. Disposition Plan: Back to previous home environment Consults called:  Cardiology Status: At the time of admission, it appears that the appropriate admission status for this patient is inpatient. This is judged to be reasonable and necessary in order to provide the required intensity of service to ensure the patient's safety given the presenting symptoms, physical exam findings, and initial radiographic and laboratory data in the context of their comorbid conditions. Patient requires inpatient status due to high intensity of service, high risk for further deterioration and high frequency of surveillance required.    Lucile Shutters MD [facility]     09/14/2020, 5:42 PM

## 2020-09-14 NOTE — ED Notes (Signed)
Logan Harris, receiving RN messaged via secure chat [phone], pt transported to floor via wheelchair by ED tech at this time

## 2020-09-14 NOTE — ED Provider Notes (Signed)
[facility] Emergency Department Provider Note  ____________________________________________   Event Date/Time   First MD Initiated Contact with Patient 09/14/20 1058     (approximate)  I have reviewed the triage vital signs and the nursing notes.   HISTORY  Chief Complaint Shortness of Breath    HPI Logan Harris is a 75 y.o. male who is otherwise healthy who comes in with shortness of breath and confusion.  Patient reports that he has had worsening shortness of breath over the past week.  This is been intermittent, worse with exertion, better at rest.  Denies any prior history of lung issues.  He does report some intermittent chest pain associated with it and pain with taking a deep breath.  But no chest pain now.  He does report drinking alcohol 3 beers daily which she states is less than normal.  He denies a history of withdrawal or any history of issues with alcohol.  He reports that he has a history of arthritis that he takes prednisone for occasionally but is not been on it recently, tramadol and meloxicam.  He states that he has not been taking too much of his tramadol.  He takes about 2 pills daily.  [place] to him for a long period of time.  He also reports some confusion.  Wife also endorses that he has been getting more confused over the past few weeks.  For instance he will say random comments that do not really make sense or he will have difficulties finishing his sentence.  When I asked patient if he had a fall he states that he he thinks that he did but he is not sure.  They also report weight loss and increasing slowness with movement.       Medical: Arthritis  There are no problems to display for this patient.    Prior to Admission medications   Medication Sig Start Date End Date Taking? Authorizing Provider  meloxicam (MOBIC) 15 MG tablet Take 15 mg by mouth daily. 06/23/20  Yes  [provider]  traMADol (ULTRAM) 50 MG tablet Take 50-100 mg by mouth in the morning, at noon, and at bedtime. 07/30/20  Yes [provider]    Allergies Patient has no known allergies.  No family history on file.  Social History Social History   Tobacco Use  . Smoking status: Never Smoker  . Smokeless tobacco: Never Used  Vaping Use  . Vaping Use: Never used  Substance Use Topics  . Alcohol use: Yes      Review of Systems Constitutional: No fever/chills, positive weight loss Eyes: No visual changes. ENT: No sore throat. Cardiovascular: No chest pain Respiratory: Positive for SOB Gastrointestinal: No abdominal pain.  No nausea, no vomiting.  No diarrhea.  No constipation. Genitourinary: Negative for dysuria. Musculoskeletal: Negative for back pain.  Positive weakness Skin: Negative for rash. Neurological: Negative for headaches, focal weakness or numbness.  Positive confusion, positive slowness with movements All other ROS negative ____________________________________________   PHYSICAL EXAM:  VITAL SIGNS: ED Triage Vitals  Enc Vitals Group     BP 09/14/20 1100 (!) 116/96     Pulse Rate 09/14/20 1100 69     Resp 09/14/20 1100 12     Temp 09/14/20 1102 97.7 F (36.5 C)     Temp Source 09/14/20 1102 Oral     SpO2 09/14/20 1100 100 %  Weight 09/14/20 1057 127 lb (57.6 kg)     Height 09/14/20 1057 5\' 11"  (1.803 m)     Head Circumference --      Peak Flow --      Pain Score 09/14/20 1056 6     Pain Loc --      Pain Edu? --      Excl. in GC? --     Constitutional: Alert and oriented.  Cachectic appearing male Eyes: Conjunctivae are normal. EOMI. Head: Atraumatic. Nose: No congestion/rhinnorhea. Mouth/Throat: Mucous membranes are moist.   Neck: No stridor. Trachea Midline. FROM Cardiovascular: Normal rate, regular rhythm. Grossly normal heart sounds.  Good peripheral circulation. Respiratory: Clear lungs with no increased work of  breathing Gastrointestinal: Soft but some mild tenderness in his upper abdomen.. No distention. No abdominal bruits.  Musculoskeletal: No lower extremity tenderness nor edema.  No joint effusions. Neurologic:  Normal speech and language. No gross focal neurologic deficits are appreciated.  Skin:  Skin is warm, dry and intact. No rash noted. Psychiatric: Mood and affect are normal. Speech and behavior are normal. GU: Deferred   ____________________________________________   LABS (all labs ordered are listed, but only abnormal results are displayed)  Labs Reviewed  CBC - Abnormal; Notable for the following components:      Result Value   RDW 17.1 (*)    All other components within normal limits  RESP PANEL BY RT-PCR (FLU A&B, COVID) ARPGX2  URINE CULTURE  BASIC METABOLIC PANEL  HEPATIC FUNCTION PANEL  BRAIN NATRIURETIC PEPTIDE  AMMONIA  URINALYSIS, COMPLETE (UACMP) WITH MICROSCOPIC  TSH  T4, FREE  TROPONIN I (HIGH SENSITIVITY)   ____________________________________________   ED ECG REPORT I, 09/16/20, the attending physician, personally viewed and interpreted this ECG.  Normal sinus rate of 76, no ST elevation but does have T wave inversions and ST flattening in 2 3 aVF V5 and V6, normal intervals.  No prior EKG to compare to ____________________________________________  RADIOLOGY I, Concha Se, personally viewed and evaluated these images (plain radiographs) as part of my medical decision making, as well as reviewing the written report by the radiologist.  ED MD interpretation: No pneumonia  Official radiology report(s): DG Chest Portable 1 View  Result Date: 09/14/2020 CLINICAL DATA:  Shortness of breath EXAM: PORTABLE CHEST 1 VIEW COMPARISON:  None. FINDINGS: There is mild scarring in the left base. The lungs elsewhere are clear. There is cardiomegaly with pulmonary vascularity normal. No adenopathy. No bone lesions. IMPRESSION: Mild scarring left base. No edema  or consolidation. Mild cardiac enlargement. Electronically Signed   By: 09/16/2020 III M.D.   On: 09/14/2020 11:20    ____________________________________________   PROCEDURES  Procedure(s) performed (including Critical Care):  .1-3 Lead EKG Interpretation Performed by: 09/16/2020, MD Authorized by: Concha Se, MD     Interpretation: normal     ECG rate:  60s   ECG rate assessment: normal     Rhythm: sinus rhythm     Ectopy: none     Conduction: normal       ____________________________________________   INITIAL IMPRESSION / ASSESSMENT AND PLAN / ED COURSE   JOAL EAKLE was evaluated in Emergency Department on 09/14/2020 for the symptoms described in the history of present illness. He was evaluated in the context of the global COVID-19 pandemic, which necessitated consideration that the patient might be at risk for infection with the SARS-CoV-2 virus that causes COVID-19. Institutional protocols and algorithms that  pertain to the evaluation of patients at risk for COVID-19 are in a state of rapid change based on information released by regulatory bodies including the CDC and federal and state organizations. These policies and algorithms were followed during the patient's care in the ED.     Pt presents with SOB and confusion.  Patient looks very cachectic on exam query if there could be an underlying cancer versus hepatic failure from his alcohol use versus a Wernicke's encephalitis versus possible exposure to tickborne illness.  We will start off with basic labs to evaluate for Electra abnormalities, AKI, liver dysfunction, ammonia.  Suspect patient will need CT imaging.  PNA-will get xray to evaluation Anemia-CBC to evaluate ACS- will get trops Arrhythmia-Will get EKG and keep on monitor.  COVID- will get testing per algorithm.  Labs show significantly elevated BNP and elevated troponin.  I wonder if patient had a heart attack.  Will consider demand secondary  to PE.  Therefore we will get CT PE given the pleuritic chest pain.  Patient was also tender on examination his abdomen and his LFTs are elevated so we will get a CT abdomen to further evaluate.  Given the confusion and possible fall will get a CT head to evaluate for intercranial hemorrhage.  CT scan shows concerns for pulmonary edema.  There is about possible atypical infection but COVID is negative.  We will add on procalcitonin by have lower suspicion for pneumonia at this time.  Given his significantly elevated BNP and troponin I wonder if patient had a heart attack a few days ago causing her shortness of breath.  Will discuss possible team for admission.  For the tick bite and the tick removal done yesterday patient's confusion was prior to the tick bite but clear if he could have a tickborne illness given how long it was attached for therefore we will prophylactically treat him with a dose of Doxy.  Discussed the hospital team and will admit patient          Clinical Course as of 09/14/20 1513  Thu Sep 14, 2020  1436 CT Head Wo Contrast [MF]    Clinical Course User Index [MF] Concha Se, MD     ____________________________________________   FINAL CLINICAL IMPRESSION(S) / ED DIAGNOSES   Final diagnoses:  Acute congestive heart failure, unspecified heart failure type (HCC)  Elevated troponin     MEDICATIONS GIVEN DURING THIS VISIT:  Medications  doxycycline (VIBRA-TABS) tablet 100 mg (has no administration in time range)  iohexol (OMNIPAQUE) 350 MG/ML injection 75 mL (75 mLs Intravenous Contrast Given 09/14/20 1358)     ED Discharge Orders    None       Note:  This document was prepared using Dragon voice recognition software and may include unintentional dictation errors.   Concha Se, MD 09/14/20 [phone]

## 2020-09-14 NOTE — ED Triage Notes (Signed)
Pt via POV from home. Pt c/o SOB that increasingly worse the past week. Pt also c/o productive cough. Per wife, pt was been more confused. Pt states when he is asked a question his mind goes blank. Pt is A&Ox4 and NAD but slow to answer.

## 2020-09-14 NOTE — ED Notes (Signed)
Pt back from CT, awaiting results, denies needs or concerns. Pt and family aware of plan of care at this point. AO x4

## 2020-09-14 NOTE — ED Notes (Signed)
Pt and visitor aware of plan of care, waiting second trop results and CT scan.

## 2020-09-14 NOTE — ED Notes (Signed)
Assumed care of pt at 1100. Pt delayed in answering questions, however answers appropriately. AO x4. Urinal placed at bedside and pt made aware of need for UA. Talking in full sentences with regular and unlabored breathing. Bladder scanned - 82ml of urine noted MD Fuller Plan made aware. Denies needs or concerns at this time. Aware of plan to wait for lab results

## 2020-09-15 ENCOUNTER — Encounter: Payer: Self-pay | Admitting: Internal Medicine

## 2020-09-15 ENCOUNTER — Inpatient Hospital Stay (HOSPITAL_COMMUNITY)
Admit: 2020-09-15 | Discharge: 2020-09-15 | Disposition: A | Discharge status: Home / Self Care | Payer: Medicare HMO | Attending: Internal Medicine | Admitting: Internal Medicine

## 2020-09-15 DIAGNOSIS — I5031 Acute diastolic (congestive) heart failure: Secondary | ICD-10-CM

## 2020-09-15 DIAGNOSIS — I10 Essential (primary) hypertension: Secondary | ICD-10-CM

## 2020-09-15 DIAGNOSIS — Z66 Do not resuscitate: Secondary | ICD-10-CM

## 2020-09-15 DIAGNOSIS — I350 Nonrheumatic aortic (valve) stenosis: Secondary | ICD-10-CM

## 2020-09-15 DIAGNOSIS — Z515 Encounter for palliative care: Secondary | ICD-10-CM

## 2020-09-15 DIAGNOSIS — Z7189 Other specified counseling: Secondary | ICD-10-CM

## 2020-09-15 DIAGNOSIS — I509 Heart failure, unspecified: Secondary | ICD-10-CM

## 2020-09-15 DIAGNOSIS — R778 Other specified abnormalities of plasma proteins: Secondary | ICD-10-CM

## 2020-09-15 DIAGNOSIS — I420 Dilated cardiomyopathy: Secondary | ICD-10-CM

## 2020-09-15 DIAGNOSIS — R7401 Elevation of levels of liver transaminase levels: Secondary | ICD-10-CM

## 2020-09-15 DIAGNOSIS — E43 Unspecified severe protein-calorie malnutrition: Secondary | ICD-10-CM

## 2020-09-15 DIAGNOSIS — I5041 Acute combined systolic (congestive) and diastolic (congestive) heart failure: Secondary | ICD-10-CM

## 2020-09-15 DIAGNOSIS — R531 Weakness: Secondary | ICD-10-CM

## 2020-09-15 LAB — ECHOCARDIOGRAM COMPLETE
AR max vel: 0.36 cm2
AV Area VTI: 0.27 cm2
AV Area mean vel: 0.3 cm2
AV Mean grad: 52 mmHg
AV Peak grad: 78.1 mmHg
Ao pk vel: 4.42 m/s
Area-P 1/2: 2.9 cm2
Height: 71 in
MV VTI: 1.61 cm2
P 1/2 time: 1039 msec
S' Lateral: 4.9 cm
Weight: 2073.6 oz

## 2020-09-15 LAB — URINE CULTURE: Culture: NO GROWTH

## 2020-09-15 LAB — BASIC METABOLIC PANEL
Anion gap: 9 (ref 5–15)
BUN: 28 mg/dL — ABNORMAL HIGH (ref 8–23)
CO2: 24 mmol/L (ref 22–32)
Calcium: 9.6 mg/dL (ref 8.9–10.3)
Chloride: 105 mmol/L (ref 98–111)
Creatinine, Ser: 0.94 mg/dL (ref 0.61–1.24)
GFR, Estimated: >60 mL/min (ref >60)
Glucose, Bld: 75 mg/dL (ref 70–99)
Potassium: 4.4 mmol/L (ref 3.5–5.1)
Sodium: 138 mmol/L (ref 135–145)

## 2020-09-15 LAB — GLUCOSE, CAPILLARY: Glucose-Capillary: 69 mg/dL — ABNORMAL LOW (ref 70–99)

## 2020-09-15 LAB — PROCALCITONIN: Procalcitonin: <0.1 ng/mL

## 2020-09-15 MED ORDER — MORPHINE SULFATE (CONCENTRATE) 10 MG/0.5ML PO SOLN: 10.0000 mg | ORAL | Status: DC | PRN

## 2020-09-15 NOTE — Discharge Summary (Signed)
Physician Discharge Summary  Logan Harris OZH:[id number] DOB: [birth year] DOA: 09/14/2020  PCP: Gracelyn Nurse, MD  Admit date: 09/14/2020 Discharge date: 09/15/2020  Discharge disposition: Home with hospice   Recommendations for Outpatient Follow-Up:   Follow-up with hospice team within 24 hours of discharge   Discharge Diagnosis:   Principal Problem:   Acute CHF (congestive heart failure) (HCC) Active Problems:   Aortic stenosis, severe   Transaminitis   Essential hypertension    Discharge Condition: Stable.  Diet recommendation:  Diet Order            Diet - low sodium heart healthy           Diet 2 gram sodium Room service appropriate? Yes; Fluid consistency: Thin  Diet effective now                   Code Status: DNR     Hospital Course:   Mr. Logan Harris is a 75 year old man with medical history significant for severe aortic valve stenosis (diagnosed in 2015 but declined surgery at that time), alcohol use disorder, suspected dementia, who was brought to the hospital because of increasing shortness of breath, confusion, fatigue and generalized weakness.  He was admitted to the hospital for acute on chronic systolic and diastolic CHF.  2D echo showed EF estimated at 20 to 25% and severe aortic stenosis.  He also has severe protein caloric malnutrition.  He was evaluated by the cardiologist and the palliative care team.  Because of his poor prognosis, his wife requested comfort measures with hospice.  His wife insisted that patient be discharged home as soon as possible because she did not want patient to spend another night in the hospital.  He was evaluated by the hospice team and arrangements have been made for hospice services at home.  Discharge plan was discussed with the patient and his wife at the bedside.     Discharge Exam:    Vitals:   09/14/20 2053 09/15/20 0428 09/15/20 0744 09/15/20 1326  BP: (!) 112/91 (!) 149/104 (!) 115/99 114/77   Pulse: 68 81 75 69  Resp: 18 18 18 18   Temp: 97.7 F (36.5 C) 98 F (36.7 C) (!) 97.4 F (36.3 C) 97.7 F (36.5 C)  TempSrc: Oral Oral Oral Oral  SpO2: 98% 98% 99% 100%  Weight:      Height:         GEN: NAD SKIN: No rash EYES: EOMI ENT: MMM CV: RRR, systolic murmur heard loudest in the aortic area PULM: CTA B ABD: soft, ND, NT, +BS CNS: AAO x 3, non focal EXT: No edema or tenderness   The results of significant diagnostics from this hospitalization (including imaging, microbiology, ancillary and laboratory) are listed below for reference.     Procedures and Diagnostic Studies:   No results found.   Labs:   Basic Metabolic Panel: Recent Labs  Lab 09/14/20 1059 09/14/20 1840 09/15/20 0426  NA 140  --  138  K 4.1  --  4.4  CL 106  --  105  CO2 25  --  24  GLUCOSE 104*  --  75  BUN 33*  --  28*  CREATININE 1.14  --  0.94  CALCIUM 9.8  --  9.6  MG  --  1.8  --   PHOS  --  3.1  --    GFR Estimated Creatinine Clearance: 59.1 mL/min (by C-G formula based on SCr of 0.94 mg/dL). Liver  Function Tests: Recent Labs  Lab 09/14/20 1059  AST 61*  ALT 50*  ALKPHOS 55  BILITOT 2.2*  PROT 6.8  ALBUMIN 4.3   No results for input(s): LIPASE, AMYLASE in the last 168 hours. Recent Labs  Lab 09/14/20 1130  AMMONIA 12   Coagulation profile No results for input(s): INR, PROTIME in the last 168 hours.  CBC: Recent Labs  Lab 09/14/20 1059  WBC 5.9  HGB 14.5  HCT 44.2  MCV 96.5  PLT 174   Cardiac Enzymes: No results for input(s): CKTOTAL, CKMB, CKMBINDEX, TROPONINI in the last 168 hours. BNP: Invalid input(s): POCBNP CBG: Recent Labs  Lab 09/15/20 0740  GLUCAP 69*   D-Dimer No results for input(s): DDIMER in the last 72 hours. Hgb A1c No results for input(s): HGBA1C in the last 72 hours. Lipid Profile No results for input(s): CHOL, HDL, LDLCALC, TRIG, CHOLHDL, LDLDIRECT in the last 72 hours. Thyroid function studies Recent Labs     09/14/20 1059  TSH 2.012   Anemia work up No results for input(s): VITAMINB12, FOLATE, FERRITIN, TIBC, IRON, RETICCTPCT in the last 72 hours. Microbiology Recent Results (from the past 240 hour(s))  Resp Panel by RT-PCR (Flu A&B, Covid) Nasopharyngeal Swab     Status: None   Collection Time: 09/14/20 11:17 AM   Specimen: Nasopharyngeal Swab; Nasopharyngeal(NP) swabs in vial transport medium  Result Value Ref Range Status   SARS Coronavirus 2 by RT PCR NEGATIVE NEGATIVE Final    Comment: (NOTE) SARS-CoV-2 target nucleic acids are NOT DETECTED.  The SARS-CoV-2 RNA is generally detectable in upper respiratory specimens during the acute phase of infection. The lowest concentration of SARS-CoV-2 viral copies this assay can detect is 138 copies/mL. A negative result does not preclude SARS-Cov-2 infection and should not be used as the sole basis for treatment or other patient management decisions. A negative result may occur with  improper specimen collection/handling, submission of specimen other than nasopharyngeal swab, presence of viral mutation(s) within the areas targeted by this assay, and inadequate number of viral copies(<138 copies/mL). A negative result must be combined with clinical observations, patient history, and epidemiological information. The expected result is Negative.  Fact Sheet for Patients:  BloggerCourse.com  Fact Sheet for Healthcare Providers:  SeriousBroker.it  This test is no t yet approved or cleared by the [place] FDA and  has been authorized for detection and/or diagnosis of SARS-CoV-2 by FDA under an Emergency Use Authorization (EUA). This EUA will remain  in effect (meaning this test can be used) for the duration of the COVID-19 declaration under Section 564(b)(1) of the Act, 21 U.S.C.section 360bbb-3(b)(1), unless the authorization is terminated  or revoked sooner.       Influenza A by PCR  NEGATIVE NEGATIVE Final   Influenza B by PCR NEGATIVE NEGATIVE Final    Comment: (NOTE) The Xpert Xpress SARS-CoV-2/FLU/RSV plus assay is intended as an aid in the diagnosis of influenza from Nasopharyngeal swab specimens and should not be used as a sole basis for treatment. Nasal washings and aspirates are unacceptable for Xpert Xpress SARS-CoV-2/FLU/RSV testing.  Fact Sheet for Patients: BloggerCourse.com  Fact Sheet for Healthcare Providers: SeriousBroker.it  This test is not yet approved or cleared by the [place] FDA and has been authorized for detection and/or diagnosis of SARS-CoV-2 by FDA under an Emergency Use Authorization (EUA). This EUA will remain in effect (meaning this test can be used) for the duration of the COVID-19 declaration under Section 564(b)(1) of the Act,  21 U.S.C. section 360bbb-3(b)(1), unless the authorization is terminated or revoked.  Performed at [facility], [address] [place], [place] [address]   Urine culture     Status: None   Collection Time: 09/14/20 12:56 PM   Specimen: Urine, Random  Result Value Ref Range Status   Specimen Description   Final    URINE, RANDOM Performed at [facility], [address], [place], [place] [address]    Special Requests   Final    NONE Performed at [facility], [address], [place], [place] [address]    Culture   Final    NO GROWTH Performed at [facility], [address], [place], [place] [address]    Report Status 09/15/2020 FINAL  Final     Discharge Instructions:   Discharge Instructions    Diet - low sodium heart healthy   Complete by: As directed    Increase activity slowly   Complete by: As directed      Allergies as of 09/15/2020   No Known Allergies     Medication List    TAKE these medications   meloxicam 15 MG tablet Commonly known as: MOBIC Take 15 mg by mouth daily.    traMADol 50 MG tablet Commonly known as: ULTRAM Take 50-100 mg by mouth in the morning, at noon, and at bedtime.         Time coordinating discharge: 35 minutes  Signed:  Lurene Shadow  [facility] 09/15/2020, 3:24 PM   Pager on [email or web address]. If 7PM-7AM, please contact night-coverage at [email or web address]

## 2020-09-15 NOTE — Consult Note (Signed)
Consultation Note Date: 09/15/2020   Patient Name: Logan Harris  DOB: [birth year]  MRN: [id number]  Age / Sex: 75 y.o., male   PCP: Baxter Hire, MD Referring Physician: Jennye Boroughs, MD   REASON FOR CONSULTATION:Establishing goals of care  Palliative Care consult requested for goals of care discussion in this 75 y.o. male with a medical history significant for CHF, back pain, alcohol use.  Patient presented to the ER from home with complaints of worsening shortness of breath x1 week, increased fatigue and weakness.  Of note wife also expressed concerns of noticeable confusion. Chest x-ray showed mild scarring and mild cardiac enlargement. CT negative for pulmonary embolism. Moderate left and small right pleural effusion and atelectasis, diffuse bilateral ground-glass opacities. BNP 4500. Cardiology consult has been placed.   Clinical Assessment and Goals of Care: I have reviewed medical records including lab results, imaging, Epic notes, and MAR, received report from the bedside RN, and assessed the patient.   I met at the bedside with patient and his wife, Darrio Bade to discuss diagnosis prognosis, [place], EOL wishes, disposition and options.  Mr. Cerrone is resting in bed. Will easily awaken. Appears confused. Will not answer questions or follow commands. He was given ativan overnight with noticeable confusion this morning. Wife is at the bedside in tears. Emotional support provided.   I introduced Palliative Medicine as specialized medical care for people living with serious illness. It focuses on providing relief from the symptoms and stress of a serious illness. The goal is to improve quality of life for both the patient and the family. Mrs. Nazir verbalized understanding and appreciation.   We discussed a brief life review of the patient, along with Mr. Madlock functional and nutritional status. Wife shares they have been married for over 53 years and has 1 daughter. Patient  is a retired Chief Financial Officer. They live on several acres of land and patient is passionate about his 5 dogs, 2 cats, and 2 horses.   Prior to admission wife speaks to patient's decline over the past 2-3 weeks. She states he has been able to assist with some ADLs however, limited with mobility and activity due to increased fatigue and shortness of breath. Appetite has decreased significantly as well.   We discussed His current illness and what it means in the larger context of His on-going co-morbidities. Natural disease trajectory and expectations at EOL were discussed.  Wife is tearful expressing her understanding of patient's current illness. She shares patient has known of his heart condition and refuses to have any procedures, surgeries or work-up in the past. Emotional support provided. Mrs. Slayton states she wants to honor his wishes which would be to be in the home with their dogs and allowed to spend what time he has left there. She knows that he may or may not do ok with medical interventions and has discussed with their daughter.   A detailed discussion was had today regarding advanced directives.  Concepts specific to code status, artifical feeding and hydration, continued IV antibiotics and rehospitalization.   The difference between a aggressive medical intervention and a palliative comfort care path were discussed at length.  I discussed at length patient's full code status with consideration of current illness, co-morbidities, and wife's expressed wishes for no aggressive interventions or heroic measures. Mrs. Popowski confirms patient would not want any forms of life-prolonging measures such as surgeries, artificial feeding/PEG, or intubation. I discussed recommendations for DNR/DNI with education provided on what  that would look like for patient. She is tearful expressing she has to honor his wishes and is requesting DNR/DNI.    Values and goals of care important to patient and family were  attempted to be elicited.   Wife is clear in expressed wishes for no further work-up or aggressive interventions. She is tearful expressing she wants to get him home and make him comfortable, allowing their 3 house dogs to be in the bed with him for what time he has left. She acknowledges if he shows some improvement she will be appreciative of all the time she has with him but knows his time is limited without interventions and his current condition.   She is hopeful he can get home today as she is not interested in him staying overnight again and feels she and family can provide all needed care in the home.   Hospice and Palliative Care services outpatient were explained and offered. Wife verbalized understanding and awareness of both palliative and hospice's goals and philosophy of care. Recommendations for outpatient hospice support provided with acknowledgement of her expressed goals for comfort in the home. Mrs. Poulson verbalized understanding and confirmed wishes for hospice outpatient support.   Education provided on hospice referral and process. She verbalized understanding and awareness it may take several days for hospice to see patient. She is ok with this with a goal of still requesting patient go home with peace knowing that hospice will eventually come out. We discussed equipment need. Wife is requesting oxygen support at this time and wishes to transport patient home by care sharing they will be able to get him in the house. EMS transportation was offered.   Questions and concerns were addressed.  Wife was encouraged to call with questions or concerns.  PMT will continue to support holistically as needed.   CODE STATUS: DNR  ADVANCE DIRECTIVES: Primary Decision Maker: Wife   SYMPTOM MANAGEMENT: see below  Palliative Prophylaxis:   Aspiration, Bowel Regimen, Delirium Protocol, Eye Care, Frequent Pain Assessment, Oral Care and Turn Reposition  PSYCHO-SOCIAL/SPIRITUAL:  Support  System: Family  Desire for further Chaplaincy support:No   Additional Recommendations (Limitations, Scope, Preferences):  Minimize Medications, No Artificial Feeding, No Surgical Procedures and goal of discharging home for comfort/EOL care  Education on hospice/palliative    PAST MEDICAL HISTORY: Past Medical History:  Diagnosis Date  . CHF (congestive heart failure) (HCC)     ALLERGIES:  has No Known Allergies.   MEDICATIONS:  Current Facility-Administered Medications  Medication Dose Route Frequency Provider Last Rate Last Admin  . 0.9 %  sodium chloride infusion  250 mL Intravenous PRN Agbata, Tochukwu, MD      . 0.9 %  sodium chloride infusion  250 mL Intravenous PRN Agbata, Tochukwu, MD      . acetaminophen (TYLENOL) tablet 650 mg  650 mg Oral Q4H PRN Agbata, Tochukwu, MD      . doxycycline (VIBRA-TABS) tablet 100 mg  100 mg Oral Once Vanessa Gem, MD      . folic acid (FOLVITE) tablet 1 mg  1 mg Oral Daily Agbata, Tochukwu, MD   1 mg at 09/15/20 1046  . furosemide (LASIX) injection 40 mg  40 mg Intravenous Q12H Agbata, Tochukwu, MD   40 mg at 09/15/20 0711  . LORazepam (ATIVAN) tablet 1-4 mg  1-4 mg Oral Q1H PRN Collier Bullock, MD       Or  . LORazepam (ATIVAN) injection 1-4 mg  1-4 mg Intravenous Q1H PRN Agbata,  Tochukwu, MD      . LORazepam (ATIVAN) tablet 0-4 mg  0-4 mg Oral Q6H Agbata, Tochukwu, MD   2 mg at 09/14/20 2353   Followed by  . [START ON 09/16/2020] LORazepam (ATIVAN) tablet 0-4 mg  0-4 mg Oral Q12H Agbata, Tochukwu, MD      . metoprolol succinate (TOPROL-XL) 24 hr tablet 25 mg  25 mg Oral Daily Agbata, Tochukwu, MD   25 mg at 09/15/20 1046  . morphine CONCENTRATE 10 MG/0.5ML oral solution 10 mg  10 mg Oral Q2H PRN Pickenpack-Cousar, Jakyla Reza N, NP      . multivitamin with minerals tablet 1 tablet  1 tablet Oral Daily Agbata, Tochukwu, MD   1 tablet at 09/15/20 1046  . ondansetron (ZOFRAN) injection 4 mg  4 mg Intravenous Q6H PRN Agbata, Tochukwu, MD      .  sodium chloride flush (NS) 0.9 % injection 3 mL  3 mL Intravenous Q12H Agbata, Tochukwu, MD   3 mL at 09/14/20 2056  . sodium chloride flush (NS) 0.9 % injection 3 mL  3 mL Intravenous PRN Agbata, Tochukwu, MD      . sodium chloride flush (NS) 0.9 % injection 3 mL  3 mL Intravenous Q12H Agbata, Tochukwu, MD   3 mL at 09/14/20 2056  . sodium chloride flush (NS) 0.9 % injection 3 mL  3 mL Intravenous PRN Agbata, Tochukwu, MD      . thiamine tablet 100 mg  100 mg Oral Daily Agbata, Tochukwu, MD   100 mg at 09/15/20 1046   Or  . thiamine (B-1) injection 100 mg  100 mg Intravenous Daily Agbata, Tochukwu, MD      . traMADol (ULTRAM) tablet 50-100 mg  50-100 mg Oral Q12H Agbata, Tochukwu, MD   50 mg at 09/15/20 1046    VITAL SIGNS: BP 114/77 (BP Location: Right Arm)   Pulse 69   Temp 97.7 F (36.5 C) (Oral)   Resp 18   Ht '5\' 11"'  (1.803 m)   Wt 58.8 kg   SpO2 100%   BMI 18.08 kg/m  Filed Weights   09/14/20 1057 09/14/20 1734  Weight: 57.6 kg 58.8 kg    Estimated body mass index is 18.08 kg/m as calculated from the following:   Height as of this encounter: '5\' 11"'  (1.803 m).   Weight as of this encounter: 58.8 kg.  LABS: CBC:    Component Value Date/Time   WBC 5.9 09/14/2020 1059   HGB 14.5 09/14/2020 1059   HCT 44.2 09/14/2020 1059   PLT 174 09/14/2020 1059   Comprehensive Metabolic Panel:    Component Value Date/Time   NA 138 09/15/2020 0426   K 4.4 09/15/2020 0426   BUN 28 (H) 09/15/2020 0426   CREATININE 0.94 09/15/2020 0426   ALBUMIN 4.3 09/14/2020 1059     Review of Systems  Unable to perform ROS: Mental status change   Physical Exam General: NAD, frail, cachectic, chronically-ill appearing Cardiovascular: regular rate and rhythm Pulmonary: diminished bilaterally  Abdomen: soft, nontender, + bowel sounds Extremities: no edema, no joint deformities Skin: no rashes, warm and dry Neurological: confused, somnolent but easily aroused   Prognosis: < 3 months  (possibly days-weeks) in the setting of comfort, CHF, BNP 4500, no further work-up(declined), deconditioned, cachectic, severe aortic stenosis, hypertension, poor nutrition, shortness of breath, alcohol use.   Discharge Planning:  Home with Hospice  Recommendations: . DNR/DNI-as requested and confirmed by wife (form completed on chart)  . Detailed discussion with  wife. Mrs. Dahlem is clear in expressed goals. Patient would not want any aggressive work, has declined in the past including valve replacement. Wished to discharge home with hospice support and allow him to spend what time he has left with his family, friends, and pets. Education provided on hospice and referral process. Wife verbalized understanding and states she is ok with discharging with awareness hospice will come out to the home and admit when available.  Donella Stade referral for outpatient hospice. Notified Olivia Mackie, RN and Tool, RN regarding request. Patient will need home oxygen. She wishes to transport home by private vehicle.  . Patient will need comfort meds at discharge o Roxanol as needed for pain shortness of breath o Ativan as needed for anxiety/agitation o Home oxygen for comfort . PMT will continue to support and follow as needed. Please call team line with urgent needs.   Palliative Performance Scale: PPS 20%                Wife expressed understanding and was in agreement with this plan.   Thank you for allowing the Palliative Medicine Team to assist in the care of this patient. Please utilize secure chat with additional questions, if there is no response within 30 minutes please call the above phone number.   Time In: 1345 Time Out: 1440 Time Total: 55 min.   Visit consisted of counseling and education dealing with the complex and emotionally intense issues of symptom management and palliative care in the setting of serious and potentially life-threatening illness.Greater than 50%  of this time was spent counseling  and coordinating care related to the above assessment and plan.  Signed by:  Alda Lea, AGPCNP-BC Palliative Medicine Team  Phone: [phone] Pager: [phone] Amion: [facility] providers are available by phone from 7am to 7pm daily and can be reached through the team cell phone.  Should this patient require assistance outside of these hours, please call the patient's attending physician.

## 2020-09-15 NOTE — Consult Note (Signed)
Cardiology Consultation:   Patient ID: Logan Harris MRN: [id number]; DOB: February 25, 1946  Admit date: 09/14/2020 Date of Consult: 09/15/2020  PCP:  Gracelyn Nurse, MD   [facility]  Cardiologist: New to [facility] Physician requesting consult: Agbata Reason for consult: Shortness of breath, CHF, aortic valve stenosis   Patient Profile:   Logan Harris is a 75 y.o. male with a hx of severe aortic valve stenosis in 2015, patient declined surgery at that time, lost to cardiology follow-up at [facility], prior history of alcohol dependence, presenting with worsening shortness of breath  History of Present Illness:   Logan Harris is a poor historian, mildly confused, most of the history provided by his wife at the bedside We discussed prior cardiac history dating back to 2015 when he was seeing cardiology at [facility] Echocardiogram at that time documenting aortic valve stenosis with high mean gradient 45 mm per mercury, it was recommended he closely follow-up with clinic every 6 months with imaging at that time.  He declined.  There was discussion concerning the need for TAVR/surgery or intervention of some degree on his aortic valve, he declined.  Wife reports he has been having worsening shortness of breath, weight loss, fatigue.  Given worsening cough and clear phlegm, weakness, wife brought him into the hospital.  Notes from nursing indicating he is confused at times.  During my interaction he quickly got out of bed to stand up to go to the restroom, was staggering on his feet, needed assistance back to sit on the side of bed so he could use a urinal.  Wife feels his breathing is close to his baseline " He would just like to go home and be with his horses"   Past Medical History:  Diagnosis Date  . CHF (congestive heart failure) (HCC)   Severe aortic valve stenosis Severe protein calorie malnutrition Alcohol abuse     Home Medications:  Prior to Admission  medications   Medication Sig Start Date End Date Taking? Authorizing Provider  meloxicam (MOBIC) 15 MG tablet Take 15 mg by mouth daily. 06/23/20  Yes [provider]  traMADol (ULTRAM) 50 MG tablet Take 50-100 mg by mouth in the morning, at noon, and at bedtime. 07/30/20  Yes [provider]    Inpatient Medications: Scheduled Meds: . doxycycline  100 mg Oral Once  . folic acid  1 mg Oral Daily  . furosemide  40 mg Intravenous Q12H  . LORazepam  0-4 mg Oral Q6H   Followed by  . [START ON 09/16/2020] LORazepam  0-4 mg Oral Q12H  . metoprolol succinate  25 mg Oral Daily  . multivitamin with minerals  1 tablet Oral Daily  . sodium chloride flush  3 mL Intravenous Q12H  . sodium chloride flush  3 mL Intravenous Q12H  . thiamine  100 mg Oral Daily   Or  . thiamine  100 mg Intravenous Daily  . traMADol  50-100 mg Oral Q12H   Continuous Infusions: . sodium chloride    . sodium chloride     PRN Meds: sodium chloride, sodium chloride, acetaminophen, LORazepam **OR** LORazepam, morphine CONCENTRATE, ondansetron (ZOFRAN) IV, sodium chloride flush, sodium chloride flush  Allergies:   No Known Allergies  Social History:   Social History   Socioeconomic History  . Marital status: Married    Spouse name: Not on file  . Number of children: Not on file  . Years of education: Not on file  . Highest education  level: Not on file  Occupational History  . Not on file  Tobacco Use  . Smoking status: Never Smoker  . Smokeless tobacco: Never Used  Vaping Use  . Vaping Use: Never used  Substance and Sexual Activity  . Alcohol use: Yes    Alcohol/week: 3.0 standard drinks    Types: 3 Cans of beer per week  . Drug use: Not on file  . Sexual activity: Not on file  Other Topics Concern  . Not on file  Social History Narrative  . Not on file   Social Determinants of Health   Financial Resource Strain: Not on file  Food Insecurity: Not on file  Transportation Needs: Not  on file  Physical Activity: Not on file  Stress: Not on file  Social Connections: Not on file  Intimate Partner Violence: Not on file    Family History:    Family History  Family history unknown: Yes     ROS:  Please see the history of present illness.  Review of Systems  Constitutional: Positive for malaise/fatigue and weight loss.  Respiratory: Positive for cough and shortness of breath.   Cardiovascular: Negative.   Gastrointestinal: Negative.   Musculoskeletal: Negative.   Neurological: Negative.   Psychiatric/Behavioral: Negative.   All other systems reviewed and are negative.   Physical Exam/Data:   Vitals:   09/14/20 2053 09/15/20 0428 09/15/20 0744 09/15/20 1326  BP: (!) 112/91 (!) 149/104 (!) 115/99 114/77  Pulse: 68 81 75 69  Resp: 18 18 18 18   Temp: 97.7 F (36.5 C) 98 F (36.7 C) (!) 97.4 F (36.3 C) 97.7 F (36.5 C)  TempSrc: Oral Oral Oral Oral  SpO2: 98% 98% 99% 100%  Weight:      Height:        Intake/Output Summary (Last 24 hours) at 09/15/2020 1634 Last data filed at 09/15/2020 1330 Gross per 24 hour  Intake 240 ml  Output 1925 ml  Net -1685 ml   Last 3 Weights 09/14/2020 09/14/2020  Weight (lbs) 129 lb 9.6 oz 127 lb  Weight (kg) 58.786 kg 57.607 kg     Body mass index is 18.08 kg/m.  General: No distress, falls asleep quickly, emaciated HEENT: normal Lymph: no adenopathy Neck: no JVD Endocrine:  No thryomegaly Vascular: No carotid bruits; FA pulses 2+ bilaterally without bruits  Cardiac:  normal S1, S2; RRR; 3/6 systolic ejection murmur right sternal border Lungs: Coarse breath sounds throughout Abd: soft, nontender, no hepatomegaly  Ext: no edema Musculoskeletal:  No deformities, BUE and BLE strength normal and equal Skin: warm and dry  Neuro:  CNs 2-12 intact, no focal abnormalities noted Psych:  Normal affect   EKG:  The EKG was personally reviewed and demonstrates:   Normal sinus rhythm rate 76 bpm LVH with repolarization  abnormality, PVCs  Telemetry:  Telemetry was personally reviewed and demonstrates:   Normal sinus rhythm  Relevant CV Studies: Echocardiogram with severely reduced ejection fraction 20 to 25% global hypokinesis critical aortic valve stenosis  Laboratory Data:  High Sensitivity Troponin:   Recent Labs  Lab 09/14/20 1059 09/14/20 1256  TROPONINIHS 85* 68*     Chemistry Recent Labs  Lab 09/14/20 1059 09/15/20 0426  NA 140 138  K 4.1 4.4  CL 106 105  CO2 25 24  GLUCOSE 104* 75  BUN 33* 28*  CREATININE 1.14 0.94  CALCIUM 9.8 9.6  GFRNONAA >60 >60  ANIONGAP 9 9    Recent Labs  Lab 09/14/20 1059  PROT 6.8  ALBUMIN 4.3  AST 61*  ALT 50*  ALKPHOS 55  BILITOT 2.2*   Hematology Recent Labs  Lab 09/14/20 1059  WBC 5.9  RBC 4.58  HGB 14.5  HCT 44.2  MCV 96.5  MCH 31.7  MCHC 32.8  RDW 17.1*  PLT 174   BNP Recent Labs  Lab 09/14/20 1059  BNP >4,500.0*    DDimer No results for input(s): DDIMER in the last 168 hours.   Radiology/Studies:  CT Head Wo Contrast  Result Date: 09/14/2020 CLINICAL DATA:  Worsening confusion. EXAM: CT HEAD WITHOUT CONTRAST TECHNIQUE: Contiguous axial images were obtained from the base of the skull through the vertex without intravenous contrast. COMPARISON:  None. FINDINGS: Brain: Age related volume loss. Mild chronic small-vessel change of the white matter. No sign of acute infarction, mass lesion, hemorrhage, hydrocephalus or extra-axial collection. Vascular: There is atherosclerotic calcification of the major vessels at the base of the brain. Skull: No skull fracture. 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 IMPRESSION: 1. No acute finding by CT. Age related volume loss and mild chronic small-vessel change of the white matter. 2. Chronic arthropathy at the C1-2 articulation with narrowing of the spinal  canal but no definite compression of the upper cervical cord. Electronically Signed   By: Paulina Fusi M.D.   On: 09/14/2020 14:33   CT Angio Chest PE W and/or Wo Contrast  Result Date: 09/14/2020 CLINICAL DATA:  Shortness of breath and productive cough over the past week as well as abdominal distension. EXAM: CT ANGIOGRAPHY CHEST CT ABDOMEN AND PELVIS WITH CONTRAST TECHNIQUE: Multidetector CT imaging of the chest was performed using the standard protocol during bolus administration of intravenous contrast. Multiplanar CT image reconstructions and MIPs were obtained to evaluate the vascular anatomy. Multidetector CT imaging of the abdomen and pelvis was performed using the standard protocol during bolus administration of intravenous contrast. CONTRAST:  75mL OMNIPAQUE IOHEXOL 350 MG/ML SOLN COMPARISON:  Same day chest radiograph, CT lumbar spine dated 07/09/2018. FINDINGS: CTA CHEST FINDINGS Cardiovascular: Satisfactory opacification of the pulmonary arteries to the segmental level. No evidence of pulmonary embolism. Annular calcifications are seen involving the aortic valve. Vascular calcifications are seen in the coronary arteries and aortic arch. The heart is enlarged. No pericardial effusion. Mediastinum/Nodes: Minimal diffuse bilateral ground-glass opacities are noted. There is no pneumothorax. Lungs/Pleura: There is a moderate left and small right pleural effusion with associated mild atelectasis. Minimal diffuse bilateral ground-glass opacities are noted. There is no pneumothorax. Musculoskeletal: Degenerative changes are most significant at T12-L1. Review of the MIP images confirms the above findings. CT ABDOMEN and PELVIS FINDINGS Hepatobiliary: No focal liver abnormality is seen. No gallstones, gallbladder wall thickening, or biliary dilatation. Pancreas: Unremarkable. No pancreatic ductal dilatation or surrounding inflammatory changes. Spleen: Normal in size without focal abnormality. Adrenals/Urinary  Tract: Adrenal glands are unremarkable. Kidneys are normal, without renal calculi, focal lesion, or hydronephrosis. Bladder is unremarkable. Stomach/Bowel: Stomach is within normal limits. No pericecal inflammatory changes are noted to suggest acute appendicitis. No evidence of bowel wall thickening, distention, or inflammatory changes. Vascular/Lymphatic: Aortic atherosclerosis. No enlarged abdominal or pelvic lymph nodes. Reproductive: Prostate is unremarkable. Bilateral hydroceles are partially imaged. Other: No abdominal wall hernia or abnormality. Small volume fluid is seen in the pelvis. Anasarca is noted Musculoskeletal: Severe degenerative changes are seen in the lumbar spine. Moderate bilateral degenerative changes are seen in both  hips. Review of the MIP images confirms the above findings. IMPRESSION: 1. No evidence of pulmonary embolism. 2. Moderate left and small right pleural effusions with associated mild atelectasis. 3. Minimal diffuse bilateral ground-glass opacities likely reflect mild pulmonary edema and/or atypical pneumonia. 4. Cardiomegaly. 5. Small volume fluid in the pelvis and anasarca. Aortic Atherosclerosis (ICD10-I70.0). Electronically Signed   By: Romona Curls M.D.   On: 09/14/2020 14:56   CT ABDOMEN PELVIS W CONTRAST  Result Date: 09/14/2020 CLINICAL DATA:  Shortness of breath and productive cough over the past week as well as abdominal distension. EXAM: CT ANGIOGRAPHY CHEST CT ABDOMEN AND PELVIS WITH CONTRAST TECHNIQUE: Multidetector CT imaging of the chest was performed using the standard protocol during bolus administration of intravenous contrast. Multiplanar CT image reconstructions and MIPs were obtained to evaluate the vascular anatomy. Multidetector CT imaging of the abdomen and pelvis was performed using the standard protocol during bolus administration of intravenous contrast. CONTRAST:  75mL OMNIPAQUE IOHEXOL 350 MG/ML SOLN COMPARISON:  Same day chest radiograph, CT  lumbar spine dated 07/09/2018. FINDINGS: CTA CHEST FINDINGS Cardiovascular: Satisfactory opacification of the pulmonary arteries to the segmental level. No evidence of pulmonary embolism. Annular calcifications are seen involving the aortic valve. Vascular calcifications are seen in the coronary arteries and aortic arch. The heart is enlarged. No pericardial effusion. Mediastinum/Nodes: Minimal diffuse bilateral ground-glass opacities are noted. There is no pneumothorax. Lungs/Pleura: There is a moderate left and small right pleural effusion with associated mild atelectasis. Minimal diffuse bilateral ground-glass opacities are noted. There is no pneumothorax. Musculoskeletal: Degenerative changes are most significant at T12-L1. Review of the MIP images confirms the above findings. CT ABDOMEN and PELVIS FINDINGS Hepatobiliary: No focal liver abnormality is seen. No gallstones, gallbladder wall thickening, or biliary dilatation. Pancreas: Unremarkable. No pancreatic ductal dilatation or surrounding inflammatory changes. Spleen: Normal in size without focal abnormality. Adrenals/Urinary Tract: Adrenal glands are unremarkable. Kidneys are normal, without renal calculi, focal lesion, or hydronephrosis. Bladder is unremarkable. Stomach/Bowel: Stomach is within normal limits. No pericecal inflammatory changes are noted to suggest acute appendicitis. No evidence of bowel wall thickening, distention, or inflammatory changes. Vascular/Lymphatic: Aortic atherosclerosis. No enlarged abdominal or pelvic lymph nodes. Reproductive: Prostate is unremarkable. Bilateral hydroceles are partially imaged. Other: No abdominal wall hernia or abnormality. Small volume fluid is seen in the pelvis. Anasarca is noted Musculoskeletal: Severe degenerative changes are seen in the lumbar spine. Moderate bilateral degenerative changes are seen in both hips. Review of the MIP images confirms the above findings. IMPRESSION: 1. No evidence of  pulmonary embolism. 2. Moderate left and small right pleural effusions with associated mild atelectasis. 3. Minimal diffuse bilateral ground-glass opacities likely reflect mild pulmonary edema and/or atypical pneumonia. 4. Cardiomegaly. 5. Small volume fluid in the pelvis and anasarca. Aortic Atherosclerosis (ICD10-I70.0). Electronically Signed   By: Romona Curls M.D.   On: 09/14/2020 14:56   DG Chest Portable 1 View  Result Date: 09/14/2020 CLINICAL DATA:  Shortness of breath EXAM: PORTABLE CHEST 1 VIEW COMPARISON:  None. FINDINGS: There is mild scarring in the left base. The lungs elsewhere are clear. There is cardiomegaly with pulmonary vascularity normal. No adenopathy. No bone lesions. IMPRESSION: Mild scarring left base. No edema or consolidation. Mild cardiac enlargement. Electronically Signed   By: Bretta Bang III M.D.   On: 09/14/2020 11:20   ECHOCARDIOGRAM COMPLETE  Result Date: 09/15/2020    ECHOCARDIOGRAM REPORT   Patient Name:   Logan Harris Date of Exam: 09/15/2020 Medical Rec #:  [id number]     Height:       71.0 in Accession #:    [id number]    Weight:       129.6 lb Date of Birth:  Jul 25, 1945    BSA:          1.753 m Patient Age:    74 years      BP:           115/99 mmHg Patient Gender: M             HR:           52 bpm. Exam Location:  [facility] Procedure: 2D Echo, Color Doppler and Cardiac Doppler Indications:     I50.31 CHF-Acute Diastolic  History:         Patient has no prior history of Echocardiogram examinations.                  Aortic Valve Disease.  Sonographer:     Humphrey Rolls RDCS (AE) Referring Phys:  [id number] Lucile Shutters Diagnosing Phys: Julien Nordmann MD IMPRESSIONS  1. Left ventricular ejection fraction, by estimation, is 20 to 25%. The left ventricle has severely decreased function. The left ventricle demonstrates global hypokinesis. The left ventricular internal cavity size was moderately dilated. Left ventricular diastolic parameters are indeterminate.  2. Right  ventricular systolic function is moderately reduced. The right ventricular size is normal. Tricuspid regurgitation signal is inadequate for assessing PA pressure.  3. Right atrial size was moderately dilated.  4. The mitral valve is normal in structure. Mild mitral valve regurgitation.  5. There is severe calcifcation of the aortic valve. There is severe thickening of the aortic valve. Aortic valve regurgitation is not visualized. Severe aortic valve stenosis. Aortic valve area, by VTI measures 0.27 cm. Aortic valve mean gradient measures 52.0 mmHg. Aortic valve Vmax measures 4.42 m/s. FINDINGS  Left Ventricle: Left ventricular ejection fraction, by estimation, is 20 to 25%. The left ventricle has severely decreased function. The left ventricle demonstrates global hypokinesis. The left ventricular internal cavity size was moderately dilated. There is no left ventricular hypertrophy. Left ventricular diastolic parameters are indeterminate. Right Ventricle: The right ventricular size is normal. No increase in right ventricular wall thickness. Right ventricular systolic function is moderately reduced. Tricuspid regurgitation signal is inadequate for assessing PA pressure. Left Atrium: Left atrial size was normal in size. Right Atrium: Right atrial size was moderately dilated. Pericardium: There is no evidence of pericardial effusion. Mitral Valve: The mitral valve is normal in structure. Mild mitral valve regurgitation. No evidence of mitral valve stenosis. MV peak gradient, 1.8 mmHg. The mean mitral valve gradient is 1.0 mmHg. Tricuspid Valve: The tricuspid valve is normal in structure. Tricuspid valve regurgitation is mild . No evidence of tricuspid stenosis. Aortic Valve: There is severe calcifcation of the aortic valve. There is severe thickening of the aortic valve. Aortic valve regurgitation is not visualized. Aortic regurgitation PHT measures 1039 msec. Severe aortic stenosis is present. Aortic valve mean  gradient measures 52.0 mmHg. Aortic valve peak gradient measures 78.1 mmHg. Aortic valve area, by VTI measures 0.27 cm. Pulmonic Valve: The pulmonic valve was normal in structure. Pulmonic valve regurgitation is not visualized. No evidence of pulmonic stenosis. Aorta: The aortic root is normal in size and structure. Venous: The inferior vena cava is normal in size with greater than 50% respiratory variability, suggesting right atrial pressure of 3 mmHg. IAS/Shunts: No atrial level shunt detected by color flow Doppler.  LEFT VENTRICLE PLAX  2D LVIDd:         5.50 cm  Diastology LVIDs:         4.90 cm  LV e' medial:    3.15 cm/s LV PW:         1.30 cm  LV E/e' medial:  21.5 LV IVS:        0.90 cm  LV e' lateral:   6.20 cm/s LVOT diam:     2.30 cm  LV E/e' lateral: 10.9 LV SV:         29 LV SV Index:   17 LVOT Area:     4.15 cm  RIGHT VENTRICLE RV Basal diam:  4.50 cm LEFT ATRIUM             Index       RIGHT ATRIUM           Index LA diam:        3.80 cm 2.17 cm/m  RA Area:     27.90 cm LA Vol (A2C):   83.2 ml 47.45 ml/m RA Volume:   110.00 ml 62.73 ml/m LA Vol (A4C):   38.3 ml 21.84 ml/m LA Biplane Vol: 58.5 ml 33.36 ml/m  AORTIC VALVE AV Area (Vmax):    0.36 cm AV Area (Vmean):   0.30 cm AV Area (VTI):     0.27 cm AV Vmax:           442.00 cm/s AV Vmean:          346.000 cm/s AV VTI:            1.090 m AV Peak Grad:      78.1 mmHg AV Mean Grad:      52.0 mmHg LVOT Vmax:         37.90 cm/s LVOT Vmean:        24.900 cm/s LVOT VTI:          0.070 m LVOT/AV VTI ratio: 0.06 AI PHT:            1039 msec  AORTA Ao Root diam: 3.50 cm MITRAL VALVE MV Area (PHT): 2.90 cm    SHUNTS MV Area VTI:   1.61 cm    Systemic VTI:  0.07 m MV Peak grad:  1.8 mmHg    Systemic Diam: 2.30 cm MV Mean grad:  1.0 mmHg MV Vmax:       0.67 m/s MV Vmean:      47.4 cm/s MV Decel Time: 262 msec MV E velocity: 67.70 cm/s MV A velocity: 35.10 cm/s MV E/A ratio:  1.93 Julien Nordmannimothy Capricia Serda MD Electronically signed by Julien Nordmannimothy Catrice Zuleta MD Signature  Date/Time: 09/15/2020/3:49:17 PM    Final      Assessment and Plan:   1. Critical aortic valve stenosis Echocardiogram showing critical aortic valve stenosis Unfortunately is not a candidate for TAVR or surgical intervention Findings previously discussed with him 2015 by [facility], he declined care at that time -Now given severe protein calorie malnutrition, likely dementia, cardiomyopathy, he is not a candidate for intervention -This was discussed with his wife, she does not want to put him through any testing -She prefers comfort, hospice, spending time with her horses -We have recommended we consult palliative/hospice  2.  Dilated cardiomyopathy Severely reduced ejection fraction 20% with global hypokinesis In the setting of critical aortic valve stenosis High risk of sudden death, cardiac arrhythmia -Lasix for palliative, Would consult hospice  3.  Severe protein calorie malnutrition Social history at home unclear, he is  somewhat confused, likely component of dementia -Hospice as above  Long discussion with patient's wife concerning above, she is in agreement    Total encounter time more than 110 minutes  Greater than 50% was spent in counseling and coordination of care with the patient   For questions or updates, please contact [facility] Please consult [email or web address] for contact info under    Signed, Julien Nordmann, MD  09/15/2020 4:34 PM

## 2020-09-15 NOTE — Progress Notes (Signed)
Discussed discharge instructions with wife and patient.  Sent instructions home.  No further questions asked

## 2020-09-15 NOTE — Progress Notes (Signed)
*  PRELIMINARY RESULTS* Echocardiogram 2D Echocardiogram has been performed.  Logan Harris 09/15/2020, 12:02 PM

## 2020-09-15 NOTE — Consult Note (Signed)
Heart Failure Nurse Navigator Note   Upon entering the room heard  the nurse and the patient's wife talking about  palliative/care.  I introduced myself and explained I understand and if conditions were to change I would be glad to come back and talk with them about heart failure.  Wife and patient voiced understanding.  Tresa Endo RN [facility]

## 2020-09-15 NOTE — Progress Notes (Signed)
[facility] room 260 [employer] engineer, [employer] [facility]) Hospital Liaison RN note Received request from [facility] for hospice services at home after discharge. Chart and patient information under review by Hospice physician. Hospice eligibility pending at this time. Spoke with wife Corrie Dandy and patient at bedside to initiate education related to hospice philosophy, services, and team approach to care. Patient/family verbalized understanding of information given. Per discussion, the plan is for discharge home by car today.   DME needs discussed.  Patient/family requests the following equipment for deliver: Oxygen 2L. Address has been verified and is correct in the chart. Corrie Dandy patient's wife at [phone] is the family contact to arrange time of equipment delivery.   Please send signed and completed DNR with patient/family. Please provide symptoms at discharge as needed for ongoing symptom management.  [facility] information and contact numbers given to [facility] as well. Above information shared with Awanya. Please call with any hospice related questions or concerns.   Thank you for the opportunity to participate in this patient's care.   Thea Gist, Charity fundraiser, BSN [employer] [phone]

## 2020-09-15 NOTE — Progress Notes (Signed)
Pt was AAOx4 and independent last night. Around [place] pt was given scheduled ativan and went to sleep. This morning around 0430 when he woke up he was very confuse, sleepy, and impulsive. Pt was not following commands, however vitals were stable, no respiratory distress noted. RN notified MD on call Dr. Antionette Char and ordered 1:1 sitter no other orders for the patient. Will endorse to the oncoming RN, will continue to monitor the patient.

## 2020-09-17 ENCOUNTER — Encounter: Payer: Self-pay | Admitting: Internal Medicine

## 2020-09-21 ENCOUNTER — Encounter: Payer: Self-pay | Admitting: Physician Assistant

## 2020-09-21 ENCOUNTER — Telehealth: Payer: Self-pay | Admitting: Cardiovascular Disease

## 2020-09-21 ENCOUNTER — Other Ambulatory Visit: Payer: Self-pay

## 2020-09-21 ENCOUNTER — Ambulatory Visit: Payer: Medicare HMO | Admitting: Physician Assistant

## 2020-09-21 VITALS — BP 120/90 | HR 82 | Ht 71.0 in | Wt 126.0 lb

## 2020-09-21 DIAGNOSIS — I10 Essential (primary) hypertension: Secondary | ICD-10-CM

## 2020-09-21 DIAGNOSIS — I350 Nonrheumatic aortic (valve) stenosis: Secondary | ICD-10-CM | POA: Diagnosis not present

## 2020-09-21 DIAGNOSIS — I420 Dilated cardiomyopathy: Secondary | ICD-10-CM

## 2020-09-21 MED ORDER — CARVEDILOL 3.125 MG PO TABS: 3.1250 mg | ORAL_TABLET | Freq: Two times a day (BID) | ORAL | 3 refills | Status: AC

## 2020-09-21 NOTE — Telephone Encounter (Signed)
Patient seen in clinic today with Marisue Ivan, PA.  He was recently discharged on 09/15/20 from [facility] Hospice consult obtained for severe AS, most likely not a candidate for repair. The patient and his wife advised today that Hospice is no longer going to be following him.  His wife was very upset/ yelling at [place], [place] about that fact that his oxygen, that Hospice arranged was going to be removed from his home.  They were told we would order this.  Jacquelyn advised the patient and his wife that we will try to find out what is going on with the oxygen and try to help facilitate him keeping this.  Jacquelyn did recommend that the patient see the Structural Heart Team.  Staff message sent to Julieta Gutting, RN.  I did end up calling [facility]) and spoke with the referral coordinator. She advised the patient was seen and deemed medically ineligible as he is ambulatory and A&O at the time. They recommended a palliative care consult may be more appropriate.   I have discussed the patient's oxygen/ Hospice status with Dr. Mariah Milling. He is aware that I ambulated the patient around the office and his O2 sat dropped to 87%.  He is aware Jacquelyn, PA was trying have the patient see the Structural Heart Team as well.  Per Dr. Mariah Milling: - he would prefer Dr. Excell Seltzer review the patient's chart prior to an appointment to see if they feel he may be a candidate for TAVR (Dr. Mariah Milling has some concern the patient may not tolerate the TAVR work up) - ok to order home O2 for the patient - ok to place an order for Palliative Care - patient needs to establish with a PCP (I advised I gave them the number to [facility] today in clinic)  I have attempted to call [facility] at [phone] to find out how we can facilitate the patient keeping his home O2. Per staff, Jasmine December, is working on this patient's case. I was sent to Sharon's voice mail and left a detailed message of the situation and to please call  back and let me know what she knows about this case.  I will hold on placing the Palliative Care consult until I can find out about his O2 status. We can then call the patient/ his wife and let them know about the O2, Palliative Care consult, & Dr. Windell Hummingbird recommendations regarding TAVR as above.

## 2020-09-21 NOTE — Progress Notes (Signed)
Office Visit    Patient Name: Logan Harris Date of Encounter: 09/22/2020  PCP:  Gracelyn Nurse, MD   [facility]  Cardiologist:[facility]. [facility] Advanced Practice Provider:  No care team member to display Electrophysiologist:  None   Chief Complaint    Chief Complaint  Patient presents with  . Hospitalization Follow-up    Hospital follow up for heart issues and would like to discuss starting Oxygen. Medications verbally reviewed with patient and wife.    75 y.o. male with history of severe aortic valve stenosis 2015 with patient declining surgery at that time, lost to follow-up at [facility], prior history of alcohol dependence, and recent admission to [facility] 09/15/2020, and who presents today for follow-up of admission.  Past Medical History    Past Medical History:  Diagnosis Date  . CHF (congestive heart failure) (HCC)    History reviewed. No pertinent surgical history.  Allergies  No Known Allergies  History of Present Illness    Logan Harris is a 75 y.o. male with PMH as above.  It was noted during admission that most of his history was provided by his wife at bedside, due to patient being a poor historian.  Prior cardiac history dating back to 2015, when seen at [facility].  Echo showed aortic valve stenosis with mean gradient 45 mmhg.  It was recommended that he follow-up closely with the clinic every 6 months with imaging at that time.  He declined.  Discussion for TAVR/surgery or some intervention was discussed with patient declining.  Wife reported shortness of breath, weight loss, and fatigue.  He reported worsening cough and clear phlegm.  Because of weakness, cough, and phlegm, he was brought to the hospital.  It was noted that he was confused to time.  Echo showed critical aortic valve stenosis.  Unfortunately, he was not felt to be a candidate for TAVR or surgical intervention.  It was noted that wife preferred comfort care at that time.  Today, 09/21/2020,  he presents to clinic after his most recent admission.    OXYGEN: A great deal of confusion surrounds today's appointment, as the primary reason for the visit is reported as a visit in order to get oxygen.  The wife reports that hospice came out to the house and stated that they were to take away the oxygen take.  She said that the patient was not deemed a hospice care patient and did not need oxygen.  She reportedly then called the office at [facility] with recommendation from the front desk that the patient could see our office, and that we would be the ones to prescribe the oxygen.  It was discussed that this is atypical for the office; however, after the wife became tearful, we had the patient ambulate today with oxygen saturations dropping to 87% at one point. Subsequently, multiple calls made during and after pt office visit to gain oxygen for the patient.  However, given this is not typically provided by our office, these calls were not helpful in obtaining O2. The structural heart team was contacted via Epic Chat Carlean Jews, PA-C via chat)  regarding if the pt was a candidate for oxygen with report that this is not done by cardiology as previously discussed with pt; however, pt declined to leave office until obtaining O2. Multiple other people provided with no success in obtaining O2. Ultimately, the patient's primary care cardiologist was contacted, given tearfulness of wife and that the pt did not  wish to leave the office until obtaining O2 from our staff, given it was reportedly promised by the front desk. Per nursing staff, MD will attempt to gain oxygen for the patient.  Pt then agreeable to go home.  ROS: He denies any chest pain today.  He reports some musculoskeletal pain at times but no CP.  He denies any dizziness, though his wife reports dizziness.  He denies any recent falls, though his wife reports recent falls.  He reports a cough that comes and goes.  He reports weight loss.    VALVE  DISCUSSION: Per wife, she was  hopeful to speak with Dr. Mariah MillingGollan today instead of an APP, as the patient's mentation has improved from that during admission. Per wife, after talking with Dr. Mariah MillingGollan in the hospital, she is of the understanding the pt is now a candidate for TAVR given his improved mentation.  --Discussed my recommendation of reaching out to the structural heart team to have them review the chart with the patient preference to then defer. It seems as if, while wife wants the valve repaired, the pt declines.  --The importance of repairing an aortic valve if deemed a candidate was discussed in detail, including information regarding life expectancy associated with critical valve stenosis without repair if confirmed by RHC (given just seen thus far on echo). RHC procedure reviewed. After discussion of reduced life span, patient initially agreeable to discuss this valve replacement with the heart team or at least have them review his chart.   At some point during checkout, the pt changed his mind regarding having the structural heart team review his chart. I was called back into the room. At that point, pt preference was for medical management over any involvement with the structural heart team. Given low output HF with soft BP, medical management was explained to pt as limited. After a very long discussion s/p called back into the room, decision made to trial low dose BB.  After I left the room with pt decision for medical management, the pt again changed his mind per the nursing staff, as I had to unfortunately move on to another pt. Per RN, they then reached out to the pt's primary cardiologist to discuss the case further. At that time, the recommendation was the same as my earlier recommendation above to have the structural heart team review the pt chart.   Chart to be sent to Dr. Excell Seltzerooper.   Home Medications   Current Outpatient Medications  Medication Instructions  . carvedilol (COREG)  3.125 mg, Oral, 2 times daily with meals  . meloxicam (MOBIC) 15 mg, Oral, Daily  . traMADol (ULTRAM) 50-100 mg, Oral, 3 times daily     Review of Systems    As outlined in HPI in great detail above. All other systems reviewed and are otherwise negative except as noted above.  Physical Exam    VS:  BP 120/90 (BP Location: Left Arm, Patient Position: Sitting, Cuff Size: Normal)   Pulse 82   Ht 5\' 11"  (1.803 m)   Wt 126 lb (57.2 kg)   SpO2 94%   BMI 17.57 kg/m  , BMI Body mass index is 17.57 kg/m. GEN: Frail and very thin male that appears older than stated age, in no acute distress.Joined by wife. HEENT: normal. Neck: Supple, no JVD, carotid bruits, or masses. Cardiac: RRR, 3/6 RUSB systolic ejection murmur. No rubs, or gallops. No clubbing, cyanosis, edema.  Radials/DP/PT 2+ and equal bilaterally.  Respiratory:  Respirations regular  and unlabored, coarse breath sounds bilaterally. GI: Soft, nontender, nondistended, BS + x 4. MS: no deformity or atrophy. Skin: warm and dry, no rash. Neuro:  Strength and sensation are intact. Psych: Normal affect.  Accessory Clinical Findings    ECG personally reviewed by me today - SR with PVCs and possible LAE, TWI in lateral leads improving and previous tracings, ST/T changes in inferior leads persistent with prolonged QTc, repolarization changes as seen in prior EKGs - no acute changes.  VITALS Reviewed today   Temp Readings from Last 3 Encounters:  09/15/20 97.7 F (36.5 C) (Oral)   BP Readings from Last 3 Encounters:  09/21/20 120/90  09/15/20 114/77   Pulse Readings from Last 3 Encounters:  09/21/20 82  09/15/20 69    Wt Readings from Last 3 Encounters:  09/21/20 126 lb (57.2 kg)  09/14/20 129 lb 9.6 oz (58.8 kg)     LABS  reviewed today   Lab Results  Component Value Date   WBC 5.9 09/14/2020   HGB 14.5 09/14/2020   HCT 44.2 09/14/2020   MCV 96.5 09/14/2020   PLT 174 09/14/2020   Lab Results  Component Value Date    CREATININE 0.94 09/15/2020   BUN 28 (H) 09/15/2020   NA 138 09/15/2020   K 4.4 09/15/2020   CL 105 09/15/2020   CO2 24 09/15/2020   Lab Results  Component Value Date   ALT 50 (H) 09/14/2020   AST 61 (H) 09/14/2020   ALKPHOS 55 09/14/2020   BILITOT 2.2 (H) 09/14/2020   No results found for: CHOL, HDL, LDLCALC, LDLDIRECT, TRIG, CHOLHDL  No results found for: HGBA1C Lab Results  Component Value Date   TSH 2.012 09/14/2020     STUDIES/PROCEDURES reviewed today   Echo 09/15/20 1. Left ventricular ejection fraction, by estimation, is 20 to 25%. The  left ventricle has severely decreased function. The left ventricle  demonstrates global hypokinesis. The left ventricular internal cavity size  was moderately dilated. Left  ventricular diastolic parameters are indeterminate.  2. Right ventricular systolic function is moderately reduced. The right  ventricular size is normal. Tricuspid regurgitation signal is inadequate  for assessing PA pressure.  3. Right atrial size was moderately dilated.  4. The mitral valve is normal in structure. Mild mitral valve  regurgitation.  5. There is severe calcifcation of the aortic valve. There is severe  thickening of the aortic valve. Aortic valve regurgitation is not  visualized. Severe aortic valve stenosis. Aortic valve area, by VTI  measures 0.27 cm. Aortic valve mean gradient  measures 52.0 mmHg. Aortic valve Vmax measures 4.42 m/s.   Assessment & Plan    Critical AS --Please refer to very detailed HPI above. Per primary cardiologist, case to be discussed with structural heart team. Pt initial report was that visit was made as he was to receive oxygen today. As this is not typically provided by our office, case ultimately referred to Dr. Mariah Milling, and he will work to get the pt oxygen. He has previously not been felt to be a TAVR candidate during his most recent admission.  Per wife, they were told this was due to poor pt mentation,  which has improved.We reviewed pt not felt TAVR candidate due to protein calorie malnutrition, poor mentation, and CM. Also, at the time of admission, wife was interested in hospice. Per wife, hospice now has been declined by hospice services, and O2 is being revoked. She is interested in valve repair. Pt is not interested  in repair. Will trial low dose BB. After several discussions with MD and pt, recommend chart review by Dr. Excell Seltzer. Of note, workup with [facility] as part of workup for valve was explained. Will defer scheduling for cath at this time.  If no plan for valvular repair, recommend palliative care consult.  Dilated CM --Please refer to detailed HPI above. Severely reduced EF with global hypokinesis.  Severe/critical aortic stenosis as above.  As we discussed in great detail today, limited long-term outlook with severe aortic stenosis.  Explained that, given low output heart failure, caution recommended with medications.  Patient appears dry on exam.  After long discussion as detailed above, will start low-dose Coreg 3.125 mg twice daily and reassess in 2 weeks.  Disposition: RTC 2 weeks to reassess after addition of low dose Coreg and review of chart by TAVR team / structural heart team.  *Please be aware that the above documentation was completed voice recognition software and may contain dictation errors.    Total time spent with patient today 120 minutes. This includes reviewing records, evaluating the patient, and coordinating care. Face-to-face time >50%. Chart was discussed with Dr. Mariah Milling, nursing staff, front desk office, hospice care, oxygen suppliers, and to be reviewed by TAVR team after reaching out to staff and all done same day. Refer to detailed HPI above.   Lennon Alstrom, PA-C

## 2020-09-21 NOTE — Telephone Encounter (Signed)
Patient calling with update  States his oxygen place is [facility] and needs anything 88 or under Phone number is [phone]

## 2020-09-21 NOTE — Patient Instructions (Signed)
Medication Instructions:  - Your physician has recommended you make the following change in your medication:   1) START coreg (carvedilol) 3.125 mg- take 1 tablet by mouth TWICE daily  *If you need a refill on your cardiac medications before your next appointment, please call your pharmacy*   Lab Work: - none ordered  If you have labs (blood work) drawn today and your tests are completely normal, you will receive your results only by: Marland Kitchen MyChart Message (if you have MyChart) OR . A paper copy in the mail If you have any lab test that is abnormal or we need to change your treatment, we will call you to review the results.   Testing/Procedures: - none ordered   Follow-Up: At [facility], you and your health needs are our priority.  As part of our continuing mission to provide you with exceptional heart care, we have created designated Provider Care Teams.  These Care Teams include your primary Cardiologist (physician) and Advanced Practice Providers (APPs -  Physician Assistants and Nurse Practitioners) who all work together to provide you with the care you need, when you need it.  We recommend signing up for the patient portal called "MyChart".  Sign up information is provided on this After Visit Summary.  MyChart is used to connect with patients for Virtual Visits (Telemedicine).  Patients are able to view lab/test results, encounter notes, upcoming appointments, etc.  Non-urgent messages can be sent to your provider as well.   To learn more about what you can do with MyChart, go to [email or web address].    Your next appointment:   2 week(s)  The format for your next appointment:   In Person  Provider:   You may see Julien Nordmann, MD or one of the following Advanced Practice Providers on your designated Care Team:    Nicolasa Ducking, NP  Eula Listen, PA-C  Marisue Ivan, PA-C  Cadence Fransico Michael, [place]  Gillian Shields, NP    Other Instructions 1) We have placed a  referral to the Structural Heart Team in [facility] for a consultation of your aortic valve- they will reach out to you directly to schedule  2) Please try to obtain a Primary Care Physician - you may call [facility]) at [phone] for assistance with this

## 2020-09-25 DIAGNOSIS — J9611 Chronic respiratory failure with hypoxia: Secondary | ICD-10-CM | POA: Diagnosis not present

## 2020-09-25 DIAGNOSIS — I350 Nonrheumatic aortic (valve) stenosis: Secondary | ICD-10-CM | POA: Diagnosis not present

## 2020-09-25 DIAGNOSIS — Z09 Encounter for follow-up examination after completed treatment for conditions other than malignant neoplasm: Secondary | ICD-10-CM | POA: Diagnosis not present

## 2020-09-25 DIAGNOSIS — Z125 Encounter for screening for malignant neoplasm of prostate: Secondary | ICD-10-CM | POA: Diagnosis not present

## 2020-09-26 DIAGNOSIS — J9611 Chronic respiratory failure with hypoxia: Secondary | ICD-10-CM | POA: Diagnosis not present

## 2020-09-28 ENCOUNTER — Ambulatory Visit: Payer: Medicare HMO | Admitting: Family

## 2020-10-05 ENCOUNTER — Ambulatory Visit: Payer: Medicare HMO | Admitting: Physician Assistant

## 2020-10-05 NOTE — Telephone Encounter (Signed)
Iona Coach, RN  Tonny Bollman, MD; Jefferey Pica, RN; Lennon Alstrom, PA-C; Mariah Milling Tollie Pizza, MD Hey,   The pt has cancelled 5/19 follow-up appointment with Marisue Ivan. Per notes in Care Everywhere the pt saw his PCP 5/9 and the wife wants him to continue managing the pt and cardiac issues (per 5/10 phone note).    Thanks,  Lauren        Previous Messages   ----- Message -----  From: Tonny Bollman, MD  Sent: 09/22/2020 12:51 PM EDT  To: Iona Coach, RN, Jefferey Pica, RN, *   I reviewed his chart. He has critical AS. Hard for me to know whether he would be a TAVR candidate unless I see him in formal consultation. I'm happy to see him, but I would suggest that he and his wife be on the same page with whether this is something they want to have done first. If he doesn't want TAVR, it doesn't make sense to do a consultation. If he wants to hear more about it, I'm happy to se him.   thx  ----- Message -----  From: Jefferey Pica, RN  Sent: 09/21/2020  2:50 PM EDT  To: Iona Coach, RN, Jefferey Pica, RN, *   Lauren,   I spoke with Dr. Mariah Milling after this patient's visit today with [facility]. It is a tricky situation I think. He was recently discharged and declined TAVR>> placed on Hospice, but Hospice has now declined him as medically ineligible. Dr. Mariah Milling was wanting Dr. Excell Seltzer to just look at the patient's chart before an actual appointment was made to see if he felt he would even be a candidate.   I am cc 'ing Dr. Excell Seltzer on this message.    Just let me know what you all think!  Gennett Garcia  ----- Message -----  From: Jefferey Pica, RN  Sent: 09/21/2020 11:11 AM EDT  To: Iona Coach, RN, Jefferey Pica, RN   Johnsie Kindred,   This patient saw Marisue Ivan today.  He needs to be seen for consult for TAVR.   Do I need to put in an ambulatory referral to cardiology for that or is there a structural heart referral?  I wasn't sure.    Thanks!

## 2020-10-09 ENCOUNTER — Ambulatory Visit: Payer: Self-pay | Admitting: Cardiology

## 2020-10-19 DIAGNOSIS — M5136 Other intervertebral disc degeneration, lumbar region: Secondary | ICD-10-CM | POA: Diagnosis not present

## 2020-10-19 DIAGNOSIS — M5030 Other cervical disc degeneration, unspecified cervical region: Secondary | ICD-10-CM | POA: Diagnosis not present

## 2020-10-19 DIAGNOSIS — M4802 Spinal stenosis, cervical region: Secondary | ICD-10-CM | POA: Diagnosis not present

## 2020-10-19 DIAGNOSIS — M5412 Radiculopathy, cervical region: Secondary | ICD-10-CM | POA: Diagnosis not present

## 2020-10-19 DIAGNOSIS — M5416 Radiculopathy, lumbar region: Secondary | ICD-10-CM | POA: Diagnosis not present

## 2020-10-19 DIAGNOSIS — M48062 Spinal stenosis, lumbar region with neurogenic claudication: Secondary | ICD-10-CM | POA: Diagnosis not present

## 2020-10-27 DIAGNOSIS — J9611 Chronic respiratory failure with hypoxia: Secondary | ICD-10-CM | POA: Diagnosis not present

## 2020-11-21 DIAGNOSIS — Z125 Encounter for screening for malignant neoplasm of prostate: Secondary | ICD-10-CM | POA: Diagnosis not present

## 2020-11-21 DIAGNOSIS — J9611 Chronic respiratory failure with hypoxia: Secondary | ICD-10-CM | POA: Diagnosis not present

## 2020-11-26 DIAGNOSIS — J9611 Chronic respiratory failure with hypoxia: Secondary | ICD-10-CM | POA: Diagnosis not present

## 2020-11-27 ENCOUNTER — Other Ambulatory Visit
Admission: RE | Admit: 2020-11-27 | Discharge: 2020-11-27 | Disposition: A | Discharge status: Home / Self Care | Payer: Medicare HMO | Source: Ambulatory Visit | Attending: Internal Medicine | Admitting: Internal Medicine

## 2020-11-27 DIAGNOSIS — J9611 Chronic respiratory failure with hypoxia: Secondary | ICD-10-CM | POA: Diagnosis not present

## 2020-11-27 DIAGNOSIS — Z0000 Encounter for general adult medical examination without abnormal findings: Secondary | ICD-10-CM | POA: Diagnosis not present

## 2020-11-27 DIAGNOSIS — R1310 Dysphagia, unspecified: Secondary | ICD-10-CM | POA: Diagnosis not present

## 2020-11-27 DIAGNOSIS — R410 Disorientation, unspecified: Secondary | ICD-10-CM | POA: Diagnosis not present

## 2020-11-27 DIAGNOSIS — I350 Nonrheumatic aortic (valve) stenosis: Secondary | ICD-10-CM | POA: Diagnosis not present

## 2020-11-27 DIAGNOSIS — F330 Major depressive disorder, recurrent, mild: Secondary | ICD-10-CM | POA: Diagnosis not present

## 2020-11-27 DIAGNOSIS — Z23 Encounter for immunization: Secondary | ICD-10-CM | POA: Diagnosis not present

## 2020-11-27 DIAGNOSIS — Z0001 Encounter for general adult medical examination with abnormal findings: Secondary | ICD-10-CM | POA: Diagnosis not present

## 2020-11-27 LAB — AMMONIA: Ammonia: 15 umol/L (ref 9–35)

## 2020-12-27 DIAGNOSIS — J9611 Chronic respiratory failure with hypoxia: Secondary | ICD-10-CM | POA: Diagnosis not present

## 2021-01-27 DIAGNOSIS — J9611 Chronic respiratory failure with hypoxia: Secondary | ICD-10-CM | POA: Diagnosis not present

## 2021-02-07 DIAGNOSIS — F1010 Alcohol abuse, uncomplicated: Secondary | ICD-10-CM | POA: Diagnosis not present

## 2021-02-07 DIAGNOSIS — I350 Nonrheumatic aortic (valve) stenosis: Secondary | ICD-10-CM | POA: Diagnosis not present

## 2021-02-07 DIAGNOSIS — I110 Hypertensive heart disease with heart failure: Secondary | ICD-10-CM | POA: Diagnosis not present

## 2021-02-07 DIAGNOSIS — I429 Cardiomyopathy, unspecified: Secondary | ICD-10-CM | POA: Diagnosis not present

## 2021-02-07 DIAGNOSIS — I509 Heart failure, unspecified: Secondary | ICD-10-CM | POA: Diagnosis not present

## 2021-02-07 DIAGNOSIS — F0390 Unspecified dementia without behavioral disturbance: Secondary | ICD-10-CM | POA: Diagnosis not present

## 2021-02-07 DIAGNOSIS — R55 Syncope and collapse: Secondary | ICD-10-CM | POA: Diagnosis not present

## 2021-02-17 DEATH — deceased

## 2022-08-10 IMAGING — CT CT CERVICAL SPINE W/O CM
3 of 5 series · 9 of 33 positions shown, 11 images · non-contrast
Comparison: None available.

CLINICAL DATA: Initial evaluation for chronic neck pain with
radiation into the bilateral upper extremities

EXAM:
CT CERVICAL SPINE WITHOUT CONTRAST
TECHNIQUE: Multidetector CT imaging of the cervical spine was performed without
intravenous contrast. Multiplanar CT image reconstructions were also
generated.

[Series 4: sag bone c-spine 2.00 sag · sagittal · 0.26mm/px · 5 of 77 slices shown, 6 images]
[im 26/77  bone]
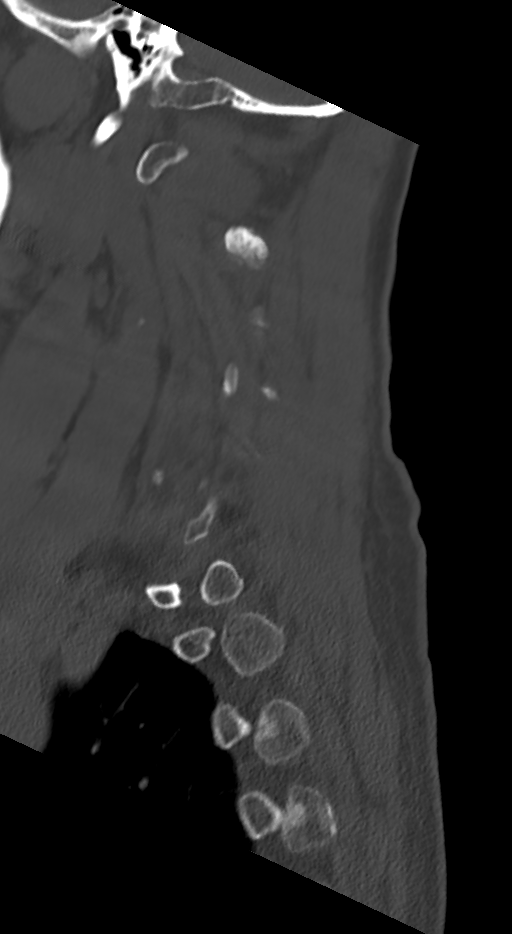
[im 32/77  bone]
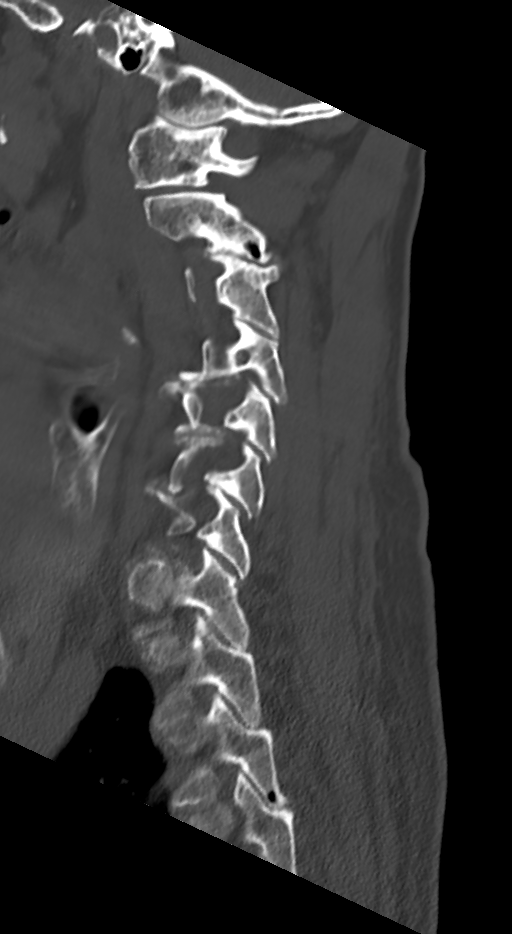
[im 39/77  soft-tissue]
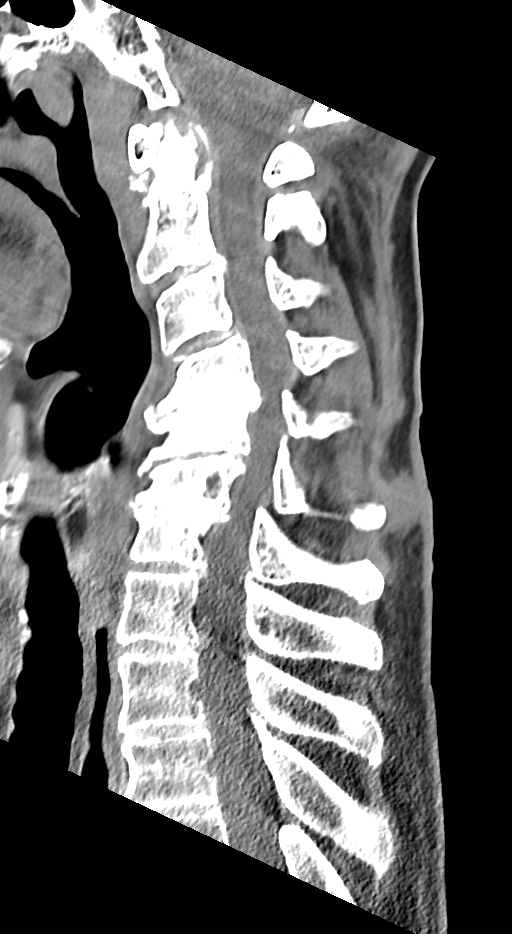
[im 39/77  bone]
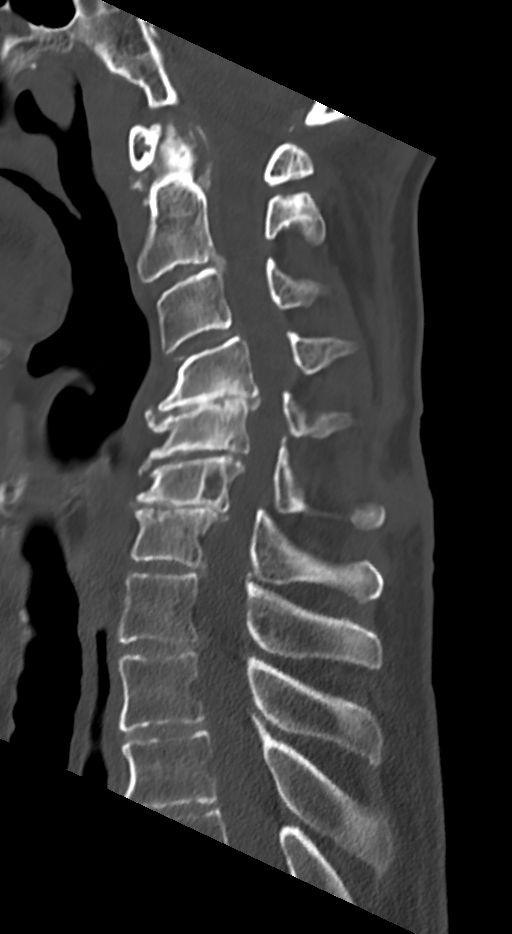
[im 45/77  bone]
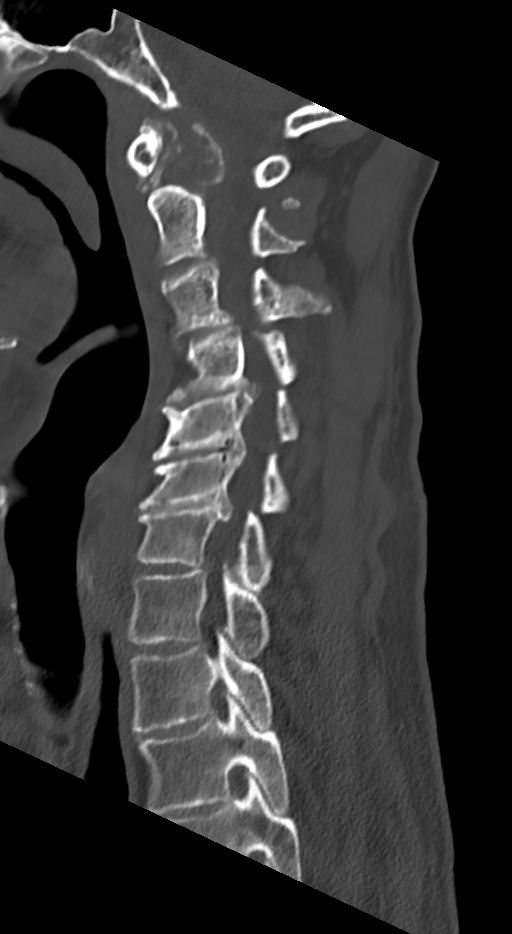
[im 51/77  bone]
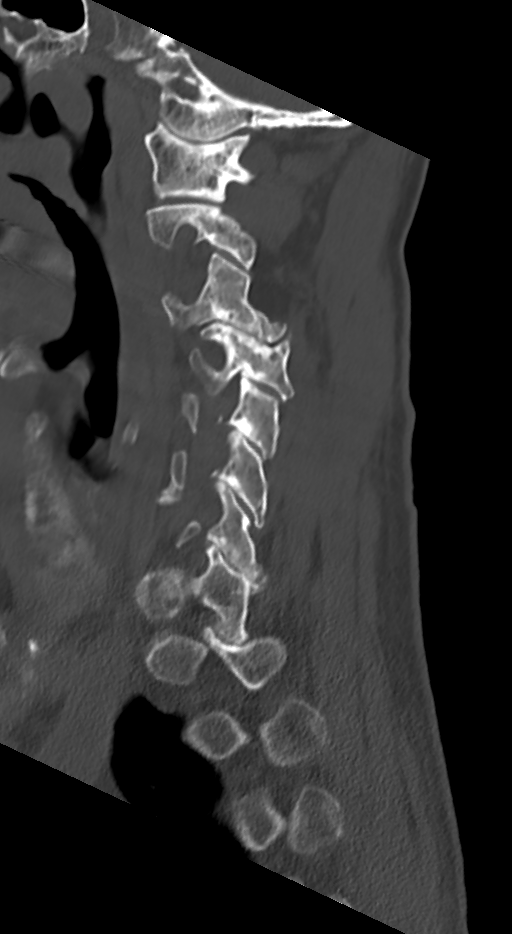

[Series 6: cor bone c-spine 2.00 cor · coronal · 0.30mm/px · 3 of 66 slices shown]
[im 16/66  bone]
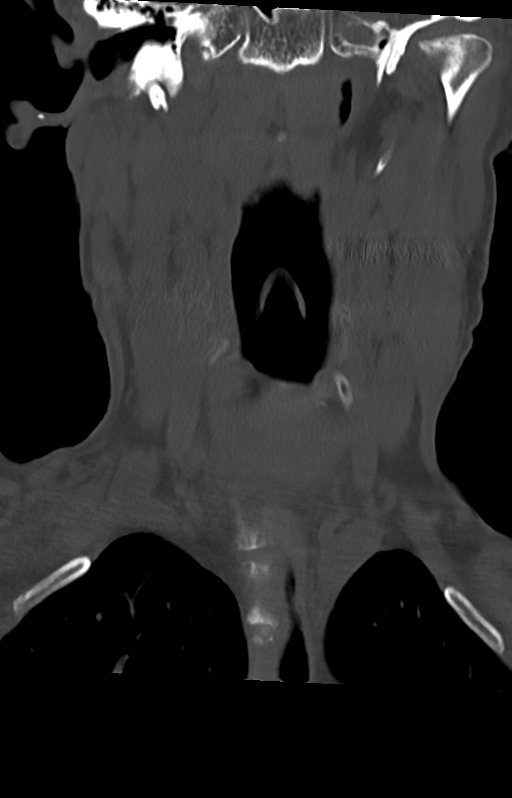
[im 27/66  bone]
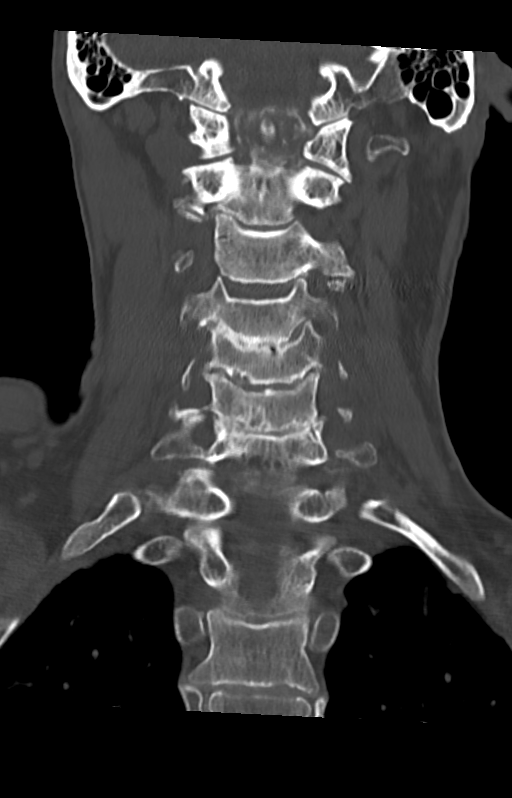
[im 39/66  bone]
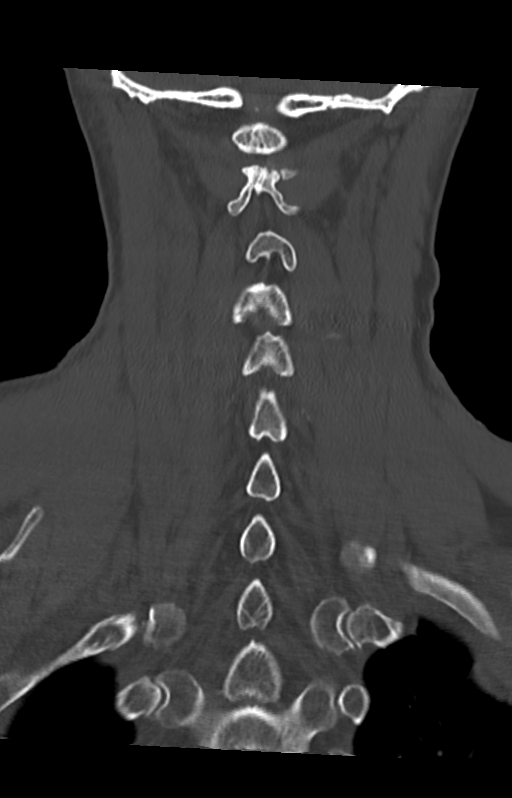

[Series 8: orthogonal axial c-spine 2.00 ax · axial · 0.26mm/px · z∈[-566,-566]mm · 1 of 121 slices shown, 2 images]
[im 73/121  soft-tissue]
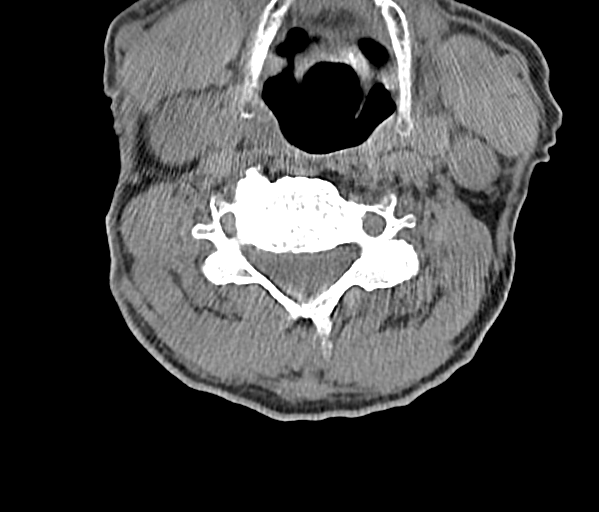
[im 73/121  bone]
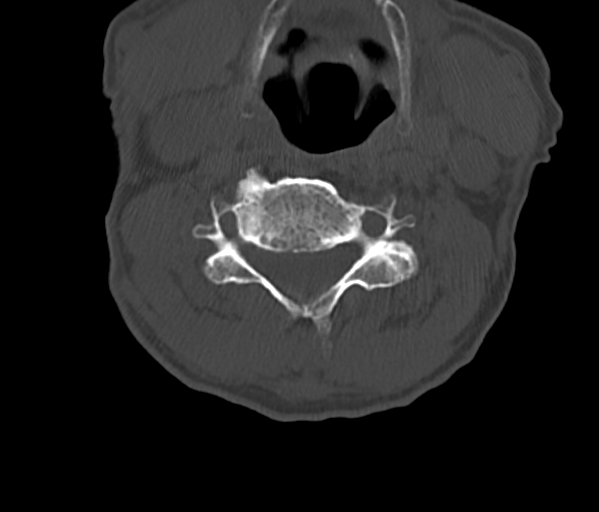

[9 of 33 positions shown; findings below may reference images not displayed]

FINDINGS: Alignment: Reversal of the normal cervical lordosis with apex at
C4-5. Trace 2 mm anterolisthesis of C3 on C4, with trace 2 mm
retrolisthesis of C6 on C7. Findings chronic and facet mediated.

Skull base and vertebrae: Visualized skull base intact. Normal C1-2
articulations are preserved. Vertebral body height maintained
without acute or chronic fracture. Subcentimeter benign hemangioma
noted within the C6 vertebral body. No other discrete or worrisome
osseous lesions.

Soft tissues and spinal canal: Paraspinous soft tissues demonstrate
no acute finding. Mild vascular calcifications noted about the
carotid bifurcations.

Disc levels:

C1-2: Prominent osteoarthritic changes about the anterior C1-2
articulation with degenerative thickening and calcification about
the tectorial membrane. No significant spinal stenosis.

C2-3: Mild disc bulge with right-sided uncovertebral hypertrophy.
Severe right with mild left facet degeneration. No significant
spinal stenosis. Severe right C3 foraminal narrowing. Left neural
foramina remains patent.

C3-4: Trace anterolisthesis. Mild diffuse disc bulge with bilateral
uncovertebral hypertrophy. Severe left with mild to moderate right
facet hypertrophy. No significant spinal stenosis. Resultant severe
left with mild right C4 foraminal stenosis.

C4-5: Advanced degenerative intervertebral disc space narrowing with
diffuse disc osteophyte complex. Broad posterior component flattens
the ventral thecal sac, greater on the right. Resultant moderate to
severe spinal stenosis with the thecal sac measuring 6 mm in AP
diameter at its most narrow point. Superimposed mild to moderate
left greater than right facet hypertrophy. Resultant severe right
with moderate left C5 foraminal stenosis.

C5-6: Advanced degenerative intervertebral disc space narrowing with
diffuse disc osteophyte complex. Broad posterior component flattens
the ventral thecal sac with resultant moderate spinal stenosis.
Superimposed mild facet hypertrophy, greater on the left. Moderate
bilateral C6 foraminal narrowing.

C6-7: Retrolisthesis. Advanced degenerative intervertebral disc
space narrowing with diffuse disc osteophyte complex. Broad
posterior component flattens the ventral thecal sac. Resultant
moderate spinal stenosis with moderate bilateral C7 foraminal
narrowing.

C7-T1: Mild disc bulge. Superimposed bilateral uncovertebral and
facet hypertrophy. No significant spinal stenosis. Mild bilateral C8
foraminal narrowing.

Upper chest: Visualized upper chest demonstrates no significant
finding. Partially visualized lungs are clear.

Other: None.
IMPRESSION: 1. Advanced degenerative disc disease and facet hypertrophy at C4-5
and C5-6 with resultant moderate to severe spinal stenosis.
2. Multifactorial degenerative changes with resultant multilevel
foraminal narrowing as above. Notable findings include severe right
C3 and left C4 foraminal stenosis, with severe right and moderate
left C5 foraminal narrowing, with moderate bilateral C6 and C7
foraminal stenosis.
